# Patient Record
Sex: Female | Born: 1965 | Race: Black or African American | Hispanic: No | Marital: Single | Smoking: Current every day smoker
Health system: Southern US, Community
[De-identification: ages and names within clinical notes are randomized; demographics above are authoritative.]

## PROBLEM LIST (undated history)

## (undated) DIAGNOSIS — I10 Essential (primary) hypertension: Secondary | ICD-10-CM

---

## 2013-03-25 ENCOUNTER — Ambulatory Visit: Payer: Self-pay | Attending: Internal Medicine

## 2013-04-27 ENCOUNTER — Ambulatory Visit: Payer: No Typology Code available for payment source | Attending: Internal Medicine | Admitting: Internal Medicine

## 2013-04-27 ENCOUNTER — Encounter: Payer: Self-pay | Admitting: Internal Medicine

## 2013-04-27 VITALS — BP 169/122 | HR 108 | Temp 99.0°F | Resp 16 | Ht 65.0 in | Wt 147.0 lb

## 2013-04-27 DIAGNOSIS — R030 Elevated blood-pressure reading, without diagnosis of hypertension: Secondary | ICD-10-CM | POA: Insufficient documentation

## 2013-04-27 DIAGNOSIS — M549 Dorsalgia, unspecified: Secondary | ICD-10-CM

## 2013-04-27 DIAGNOSIS — Z0000 Encounter for general adult medical examination without abnormal findings: Secondary | ICD-10-CM

## 2013-04-27 DIAGNOSIS — IMO0001 Reserved for inherently not codable concepts without codable children: Secondary | ICD-10-CM

## 2013-04-27 LAB — LIPID PANEL
Cholesterol: 158 mg/dL (ref 0–200)
HDL: 51 mg/dL (ref >39)
LDL Cholesterol: 81 mg/dL (ref 0–99)
Triglycerides: 130 mg/dL (ref <150)

## 2013-04-27 LAB — COMPREHENSIVE METABOLIC PANEL
AST: 23 U/L (ref 0–37)
Albumin: 4.4 g/dL (ref 3.5–5.2)
Alkaline Phosphatase: 73 U/L (ref 39–117)
BUN: 9 mg/dL (ref 6–23)
CO2: 30 mEq/L (ref 19–32)
Creat: 0.63 mg/dL (ref 0.50–1.10)
Glucose, Bld: 98 mg/dL (ref 70–99)
Potassium: 4.3 mEq/L (ref 3.5–5.3)

## 2013-04-27 LAB — HEMOGLOBIN A1C
Hgb A1c MFr Bld: 6 % — ABNORMAL HIGH (ref <5.7)
Mean Plasma Glucose: 126 mg/dL — ABNORMAL HIGH (ref <117)

## 2013-04-27 LAB — CBC WITH DIFFERENTIAL/PLATELET
Basophils Absolute: 0 10*3/uL (ref 0.0–0.1)
Basophils Relative: 0 % (ref 0–1)
Eosinophils Absolute: 0.1 10*3/uL (ref 0.0–0.7)
Eosinophils Relative: 1 % (ref 0–5)
HCT: 40.6 % (ref 36.0–46.0)
Hemoglobin: 12.8 g/dL (ref 12.0–15.0)
MCH: 21.8 pg — ABNORMAL LOW (ref 26.0–34.0)
MCHC: 31.5 g/dL (ref 30.0–36.0)
Monocytes Absolute: 0.5 10*3/uL (ref 0.1–1.0)
Monocytes Relative: 6 % (ref 3–12)
Neutro Abs: 4.4 10*3/uL (ref 1.7–7.7)
WBC: 7.8 10*3/uL (ref 4.0–10.5)

## 2013-04-27 MED ORDER — ATENOLOL 25 MG PO TABS: 25.0000 mg | ORAL_TABLET | Freq: Every day | ORAL | Status: DC

## 2013-04-27 NOTE — Progress Notes (Signed)
Pt is here to establish care. Pt is requesting a physical. She is requesting referrals. Pt states that her left side has occasional pain.

## 2013-04-27 NOTE — Progress Notes (Signed)
Patient ID: Regina Daugherty, female   DOB: 12/04/1965, 47 y.o.   MRN: [id number] Patient Demographics  Regina Daugherty, is a 47 y.o. female  ZDG:[id number]  [id number]  DOB - 1965/06/06  Chief Complaint  Patient presents with  . Establish Care        Subjective:   Regina Daugherty today is here to establish primary care.  Patient is a 47 year old female with no known medical history, does not follow any physicians or medical care presented to establish care. Patient's BP was 169/122 at the time of arrival, states that she does know if she has any hypertension. States that she's anxious. She never had mammogram. Diabetes runs in her family. Patient also reports that she has neck pain, left arm numbness, back pain, left leg pain. No chest pain, palpitations, diaphoresis. Patient has No headache, No chest pain, No abdominal pain - No Nausea, No Cough - SOB.  Objective:    Filed Vitals:   04/27/13 1359  BP: 169/122  Pulse: 108  Temp: 99 F (37.2 C)  TempSrc: Oral  Resp: 16  Height: 5\' 5"  (1.651 m)  Weight: 147 lb (66.679 kg)  SpO2: 99%     ALLERGIES:  No Known Allergies  PAST MEDICAL HISTORY: History reviewed. No pertinent past medical history.  PAST SURGICAL HISTORY: History reviewed. No pertinent past surgical history.  FAMILY HISTORY: Family History  Problem Relation Age of Onset  . Cancer Mother   . Diabetes Mother   . Cancer Father   . Diabetes Father   . Cancer Sister   . Heart disease Sister   . Hypertension Sister   . Diabetes Sister     MEDICATIONS AT HOME: Prior to Admission medications   Medication Sig Start Date End Date Taking? Authorizing Provider  atenolol (TENORMIN) 25 MG tablet Take 1 tablet (25 mg total) by mouth daily. 04/27/13   Ripudeep Jenna Luo, MD    REVIEW OF SYSTEMS:  Constitutional:   No   Fevers, chills, fatigue.  HEENT:    No headaches, Sore throat,   Cardio-vascular: No chest pain,  Orthopnea, swelling in lower  extremities, anasarca, palpitations  GI:  No abdominal pain, nausea, vomiting, diarrhea  Resp: No shortness of breath,  No coughing up of blood.No cough.No wheezing.  Skin:  no rash or lesions.  GU:  no dysuria, change in color of urine, no urgency or frequency.  No flank pain.  Musculoskeletal: No joint pain or swelling.  No decreased range of motion.  No back pain.  Psych: No change in mood or affect. No depression or anxiety.  No memory loss.   Exam  General appearance :Awake, alert, NAD, Speech Clear. HEENT: Atraumatic and Normocephalic, PERLA Neck: supple, no JVD. No cervical lymphadenopathy.  Chest: clear to auscultation bilaterally, no wheezing, rales or rhonchi CVS: S1 S2 regular, no murmurs.  Abdomen: soft, NBS, NT, ND, no gaurding, rigidity or rebound. Extremities: No cyanosis, clubbing, B/L Lower Ext shows no edema,  Neurology: Awake alert, and oriented X 3, CN II-XII intact, Non focal Skin:No Rash or lesions Wounds: N/A    Data Review   Basic Metabolic Panel: No results found for this basename: NA, K, CL, CO2, GLUCOSE, BUN, CREATININE, CALCIUM, MG, PHOS,  in the last 168 hours Liver Function Tests: No results found for this basename: AST, ALT, ALKPHOS, BILITOT, PROT, ALBUMIN,  in the last 168 hours  CBC: No results found for this basename: WBC, NEUTROABS, HGB, HCT, MCV, PLT,  in the last 168 hours ------------------------------------------------------------------------------------------------------------------  No results found for this basename: HGBA1C,  in the last 72 hours ------------------------------------------------------------------------------------------------------------------ No results found for this basename: CHOL, HDL, LDLCALC, TRIG, CHOLHDL, LDLDIRECT,  in the last 72 hours ------------------------------------------------------------------------------------------------------------------ No results found for this basename: TSH, T4TOTAL,  FREET3, T3FREE, THYROIDAB,  in the last 72 hours ------------------------------------------------------------------------------------------------------------------ No results found for this basename: VITAMINB12, FOLATE, FERRITIN, TIBC, IRON, RETICCTPCT,  in the last 72 hours  Coagulation profile  No results found for this basename: INR, PROTIME,  in the last 168 hours    Assessment & Plan   Active Problems: Patient Active Problem List   Diagnosis Date Noted  . Elevated BP -Patient's BP is still very high although she has not been diagnosed formally with hypertension  - Will check the labs as stated below, start patient on atenolol 25 mg daily    04/27/2013  . Periodic health assessment, general screening, adult - Check CBC, CMET, lipid panel, TSH, folic acid, vitamin B12, hemoglobin A1c - Ambulatory referral to OB/GYN for Pap smear - Screening mammogram ordered - She declined flu shot  04/27/2013  . Back pain -  obtain x-ray of the cervical, thoracic, lumbar spine.     04/27/2013     recommendations : follow labs, patient will be called with any abnormal results   Follow-up in  2 months    RAI,RIPUDEEP M.D. 04/27/2013, 2:34 PM

## 2013-04-28 ENCOUNTER — Other Ambulatory Visit: Payer: Self-pay | Admitting: Internal Medicine

## 2013-04-28 DIAGNOSIS — Z1231 Encounter for screening mammogram for malignant neoplasm of breast: Secondary | ICD-10-CM

## 2013-04-28 LAB — FOLATE: Folate: >20 ng/mL

## 2013-05-23 ENCOUNTER — Other Ambulatory Visit: Payer: Self-pay | Admitting: Internal Medicine

## 2013-05-23 ENCOUNTER — Ambulatory Visit (HOSPITAL_COMMUNITY)
Admission: RE | Admit: 2013-05-23 | Discharge: 2013-05-23 | Disposition: A | Discharge status: Home / Self Care | Payer: No Typology Code available for payment source | Source: Ambulatory Visit | Attending: Internal Medicine | Admitting: Internal Medicine

## 2013-05-23 DIAGNOSIS — Z0000 Encounter for general adult medical examination without abnormal findings: Secondary | ICD-10-CM

## 2013-05-23 DIAGNOSIS — IMO0001 Reserved for inherently not codable concepts without codable children: Secondary | ICD-10-CM

## 2013-05-23 DIAGNOSIS — M47812 Spondylosis without myelopathy or radiculopathy, cervical region: Secondary | ICD-10-CM | POA: Insufficient documentation

## 2013-05-23 DIAGNOSIS — I7090 Unspecified atherosclerosis: Secondary | ICD-10-CM | POA: Insufficient documentation

## 2013-05-23 DIAGNOSIS — M25559 Pain in unspecified hip: Secondary | ICD-10-CM | POA: Insufficient documentation

## 2013-05-23 DIAGNOSIS — M25519 Pain in unspecified shoulder: Secondary | ICD-10-CM | POA: Insufficient documentation

## 2013-05-23 DIAGNOSIS — R030 Elevated blood-pressure reading, without diagnosis of hypertension: Secondary | ICD-10-CM

## 2013-05-23 DIAGNOSIS — M4000 Postural kyphosis, site unspecified: Secondary | ICD-10-CM | POA: Insufficient documentation

## 2013-05-23 DIAGNOSIS — M47817 Spondylosis without myelopathy or radiculopathy, lumbosacral region: Secondary | ICD-10-CM | POA: Insufficient documentation

## 2013-05-23 DIAGNOSIS — M4120 Other idiopathic scoliosis, site unspecified: Secondary | ICD-10-CM | POA: Insufficient documentation

## 2013-05-23 DIAGNOSIS — M79609 Pain in unspecified limb: Secondary | ICD-10-CM | POA: Insufficient documentation

## 2013-05-23 DIAGNOSIS — M5137 Other intervertebral disc degeneration, lumbosacral region: Secondary | ICD-10-CM | POA: Insufficient documentation

## 2013-05-23 DIAGNOSIS — M51379 Other intervertebral disc degeneration, lumbosacral region without mention of lumbar back pain or lower extremity pain: Secondary | ICD-10-CM | POA: Insufficient documentation

## 2013-05-23 DIAGNOSIS — R209 Unspecified disturbances of skin sensation: Secondary | ICD-10-CM | POA: Insufficient documentation

## 2013-06-01 ENCOUNTER — Ambulatory Visit: Payer: No Typology Code available for payment source

## 2013-06-02 ENCOUNTER — Ambulatory Visit
Admission: RE | Admit: 2013-06-02 | Discharge: 2013-06-02 | Disposition: A | Discharge status: Home / Self Care | Payer: No Typology Code available for payment source | Source: Ambulatory Visit | Attending: Internal Medicine | Admitting: Internal Medicine

## 2013-06-02 DIAGNOSIS — Z1231 Encounter for screening mammogram for malignant neoplasm of breast: Secondary | ICD-10-CM

## 2013-06-14 ENCOUNTER — Encounter: Payer: Self-pay | Admitting: Obstetrics & Gynecology

## 2013-06-28 ENCOUNTER — Ambulatory Visit: Payer: Self-pay | Attending: Internal Medicine | Admitting: Internal Medicine

## 2013-06-28 ENCOUNTER — Ambulatory Visit: Payer: Self-pay | Attending: Internal Medicine

## 2013-06-28 ENCOUNTER — Encounter: Payer: Self-pay | Admitting: Internal Medicine

## 2013-06-28 VITALS — BP 130/80 | HR 87 | Temp 98.3°F | Resp 16 | Ht 65.0 in | Wt 150.0 lb

## 2013-06-28 DIAGNOSIS — I10 Essential (primary) hypertension: Secondary | ICD-10-CM | POA: Insufficient documentation

## 2013-06-28 DIAGNOSIS — IMO0001 Reserved for inherently not codable concepts without codable children: Secondary | ICD-10-CM

## 2013-06-28 DIAGNOSIS — Z1211 Encounter for screening for malignant neoplasm of colon: Secondary | ICD-10-CM

## 2013-06-28 DIAGNOSIS — Z09 Encounter for follow-up examination after completed treatment for conditions other than malignant neoplasm: Secondary | ICD-10-CM | POA: Insufficient documentation

## 2013-06-28 DIAGNOSIS — F17200 Nicotine dependence, unspecified, uncomplicated: Secondary | ICD-10-CM

## 2013-06-28 DIAGNOSIS — R7301 Impaired fasting glucose: Secondary | ICD-10-CM | POA: Insufficient documentation

## 2013-06-28 DIAGNOSIS — M1990 Unspecified osteoarthritis, unspecified site: Secondary | ICD-10-CM

## 2013-06-28 HISTORY — DX: Unspecified osteoarthritis, unspecified site: M19.90

## 2013-06-28 HISTORY — DX: Essential (primary) hypertension: I10

## 2013-06-28 HISTORY — DX: Nicotine dependence, unspecified, uncomplicated: F17.200

## 2013-06-28 HISTORY — DX: Impaired fasting glucose: R73.01

## 2013-06-28 HISTORY — DX: Reserved for inherently not codable concepts without codable children: IMO0001

## 2013-06-28 MED ORDER — NICOTINE 21 MG/24HR TD PT24: 21.0000 mg | MEDICATED_PATCH | Freq: Every day | TRANSDERMAL | Status: DC

## 2013-06-28 MED ORDER — ATENOLOL 25 MG PO TABS: 25.0000 mg | ORAL_TABLET | Freq: Every day | ORAL | Status: DC

## 2013-06-28 MED ORDER — NAPROXEN 500 MG PO TABS: 500.0000 mg | ORAL_TABLET | Freq: Two times a day (BID) | ORAL | Status: DC

## 2013-06-28 NOTE — Progress Notes (Signed)
Pt here to f/u HTn with medication management. Taking meds daily BP- 142/ 94 educated Need refill

## 2013-06-28 NOTE — Progress Notes (Signed)
MRN: [id number] Name: Regina CroftLucinda Funk  Sex: female Age: 48 y.o. DOB: 1965-07-03  Allergies: Review of patient's allergies indicates no known allergies.  Chief Complaint  Patient presents with  . Follow-up  . Hypertension    HPI: Patient is 48 y.o. female who comes today for followup, she has history of hypertension currently taking Tenormin today milligram denies any headache dizziness chest and shortness of breath, has history of back pain, she had x-rays done reported to have DJD, she had a blood work done which was reviewed noticed to have impaired fasting glucose, patient has family history of diabetes. Patient also smokes cigarettes , agreeable to try nicotine patch.   History reviewed. No pertinent past medical history.  History reviewed. No pertinent past surgical history.    Medication List       This list is accurate as of: 06/28/13 11:24 AM.  Always use your most recent med list.               atenolol 25 MG tablet  Commonly known as:  TENORMIN  Take 1 tablet (25 mg total) by mouth daily.     naproxen 500 MG tablet  Commonly known as:  NAPROSYN  Take 1 tablet (500 mg total) by mouth 2 (two) times daily with a meal.     nicotine 21 mg/24hr patch  Commonly known as:  NICODERM CQ  Place 1 patch (21 mg total) onto the skin daily.        Meds ordered this encounter  Medications  . nicotine (NICODERM CQ) 21 mg/24hr patch    Sig: Place 1 patch (21 mg total) onto the skin daily.    Dispense:  28 patch    Refill:  0  . naproxen (NAPROSYN) 500 MG tablet    Sig: Take 1 tablet (500 mg total) by mouth 2 (two) times daily with a meal.    Dispense:  30 tablet    Refill:  2  . atenolol (TENORMIN) 25 MG tablet    Sig: Take 1 tablet (25 mg total) by mouth daily.    Dispense:  30 tablet    Refill:  3     There is no immunization history on file for this patient.  Family History  Problem Relation Age of Onset  . Cancer Mother   . Diabetes Mother   .  Cancer Father   . Diabetes Father   . Cancer Sister   . Heart disease Sister   . Hypertension Sister   . Diabetes Sister     History  Substance Use Topics  . Smoking status: Current Every Day Smoker -- 0.50 packs/day for 15 years    Types: Cigarettes  . Smokeless tobacco: Not on file  . Alcohol Use: 1.0 oz/week    2 drink(s) per week     Comment: couple of beers     Review of Systems   As noted in HPI  Filed Vitals:   06/28/13 1054  BP: 130/80  Pulse:   Temp:   Resp:     Physical Exam  Physical Exam  Constitutional: No distress.  Eyes: EOM are normal. Pupils are equal, round, and reactive to light.  Cardiovascular: Normal rate, regular rhythm and normal heart sounds.   Pulmonary/Chest: Breath sounds normal. No respiratory distress. She has no wheezes. She has no rales.  Musculoskeletal: She exhibits no edema.  Minimal lower lumbar paraspinal tenderness    CBC    Component Value Date/Time   WBC 7.8  04/27/2013 1442   RBC 5.88* 04/27/2013 1442   HGB 12.8 04/27/2013 1442   HCT 40.6 04/27/2013 1442   PLT 294 04/27/2013 1442   MCV 69.0* 04/27/2013 1442   LYMPHSABS 2.8 04/27/2013 1442   MONOABS 0.5 04/27/2013 1442   EOSABS 0.1 04/27/2013 1442   BASOSABS 0.0 04/27/2013 1442    CMP     Component Value Date/Time   NA 142 04/27/2013 1442   K 4.3 04/27/2013 1442   CL 103 04/27/2013 1442   CO2 30 04/27/2013 1442   GLUCOSE 98 04/27/2013 1442   BUN 9 04/27/2013 1442   CREATININE 0.63 04/27/2013 1442   CALCIUM 9.9 04/27/2013 1442   PROT 7.4 04/27/2013 1442   ALBUMIN 4.4 04/27/2013 1442   AST 23 04/27/2013 1442   ALT 21 04/27/2013 1442   ALKPHOS 73 04/27/2013 1442   BILITOT 0.7 04/27/2013 1442    Lab Results  Component Value Date/Time   CHOL 158 04/27/2013  2:42 PM    No components found with this basename: hga1c    Lab Results  Component Value Date/Time   AST 23 04/27/2013  2:42 PM    Assessment and Plan  Essential hypertension,  benign Manual blood pressure is 130/80 Continue with Tenormin, also advise for low salt diet  DJD (degenerative joint disease) - Plan: Advised patient to apply heating pad, naproxen (NAPROSYN) 500 MG tablet when necessary  IFG (impaired fasting glucose) Advised patient for low carbohydrate diet  Smoking - Plan: nicotine (NICODERM CQ) 21 mg/24hr patch  Special screening for malignant neoplasms, colon - Plan: Ambulatory referral to Gastroenterology   Health Maintenance -Colonoscopy: Referral done -Mammogram: Up to date Patient declined flu shot   Return in about 3 months (around 09/25/2013).  Doris Cheadle, MD

## 2013-06-28 NOTE — Patient Instructions (Addendum)
Diabetes Meal Planning Guide The diabetes meal planning guide is a tool to help you plan your meals and snacks. It is important for people with diabetes to manage their blood glucose (sugar) levels. Choosing the right foods and the right amounts throughout your day will help control your blood glucose. Eating right can even help you improve your blood pressure and reach or maintain a healthy weight. CARBOHYDRATE COUNTING MADE EASY When you eat carbohydrates, they turn to sugar. This raises your blood glucose level. Counting carbohydrates can help you control this level so you feel better. When you plan your meals by counting carbohydrates, you can have more flexibility in what you eat and balance your medicine with your food intake. Carbohydrate counting simply means adding up the total amount of carbohydrate grams in your meals and snacks. Try to eat about the same amount at each meal. Foods with carbohydrates are listed below. Each portion below is 1 carbohydrate serving or 15 grams of carbohydrates. Ask your dietician how many grams of carbohydrates you should eat at each meal or snack. Grains and Starches  1 slice bread.   English muffin or hotdog/hamburger bun.   cup cold cereal (unsweetened).   cup cooked pasta or rice.   cup starchy vegetables (corn, potatoes, peas, beans, winter squash).  1 tortilla (6 inches).   bagel.  1 waffle or pancake (size of a CD).   cup cooked cereal.  4 to 6 small crackers. *Whole grain is recommended. Fruit  1 cup fresh unsweetened berries, melon, papaya, pineapple.  1 small fresh fruit.   banana or mango.   cup fruit juice (4 oz unsweetened).   cup canned fruit in natural juice or water.  2 tbs dried fruit.  12 to 15 grapes or cherries. Milk and Yogurt  1 cup fat-free or 1% milk.  1 cup soy milk.  6 oz light yogurt with sugar-free sweetener.  6 oz low-fat soy yogurt.  6 oz plain yogurt. Vegetables  1 cup raw or  cup  cooked is counted as 0 carbohydrates or a "free" food.  If you eat 3 or more servings at 1 meal, count them as 1 carbohydrate serving. Other Carbohydrates   oz chips or pretzels.   cup ice cream or frozen yogurt.   cup sherbet or sorbet.  2 inch square cake, no frosting.  1 tbs honey, sugar, jam, jelly, or syrup.  2 small cookies.  3 squares of graham crackers.  3 cups popcorn.  6 crackers.  1 cup broth-based soup.  Count 1 cup casserole or other mixed foods as 2 carbohydrate servings.  Foods with less than 20 calories in a serving may be counted as 0 carbohydrates or a "free" food. You may want to purchase a book or computer software that lists the carbohydrate gram counts of different foods. In addition, the nutrition facts panel on the labels of the foods you eat are a good source of this information. The label will tell you how big the serving size is and the total number of carbohydrate grams you will be eating per serving. Divide this number by 15 to obtain the number of carbohydrate servings in a portion. Remember, 1 carbohydrate serving equals 15 grams of carbohydrate. SERVING SIZES Measuring foods and serving sizes helps you make sure you are getting the right amount of food. The list below tells how big or small some common serving sizes are.  1 oz.........4 stacked dice.  3 oz.........Deck of cards.  1 tsp........Tip   of little finger.  1 tbs........Thumb.  2 tbs........Golf ball.   cup.......Half of a fist.  1 cup........A fist. SAMPLE DIABETES MEAL PLAN Below is a sample meal plan that includes foods from the grain and starches, dairy, vegetable, fruit, and meat groups. A dietician can individualize a meal plan to fit your calorie needs and tell you the number of servings needed from each food group. However, controlling the total amount of carbohydrates in your meal or snack is more important than making sure you include all of the food groups at every  meal. You may interchange carbohydrate containing foods (dairy, starches, and fruits). The meal plan below is an example of a 2000 calorie diet using carbohydrate counting. This meal plan has 17 carbohydrate servings. Breakfast  1 cup oatmeal (2 carb servings).   cup light yogurt (1 carb serving).  1 cup blueberries (1 carb serving).   cup almonds. Snack  1 large apple (2 carb servings).  1 low-fat string cheese stick. Lunch  Chicken breast salad.  1 cup spinach.   cup chopped tomatoes.  2 oz chicken breast, sliced.  2 tbs low-fat Italian dressing.  12 whole-wheat crackers (2 carb servings).  12 to 15 grapes (1 carb serving).  1 cup low-fat milk (1 carb serving). Snack  1 cup carrots.   cup hummus (1 carb serving). Dinner  3 oz broiled salmon.  1 cup brown rice (3 carb servings). Snack  1  cups steamed broccoli (1 carb serving) drizzled with 1 tsp olive oil and lemon juice.  1 cup light pudding (2 carb servings). DIABETES MEAL PLANNING WORKSHEET Your dietician can use this worksheet to help you decide how many servings of foods and what types of foods are right for you.  BREAKFAST Food Group and Servings / Carb Servings Grain/Starches __________________________________ Dairy __________________________________________ Vegetable ______________________________________ Fruit ___________________________________________ Meat __________________________________________ Fat ____________________________________________ LUNCH Food Group and Servings / Carb Servings Grain/Starches ___________________________________ Dairy ___________________________________________ Fruit ____________________________________________ Meat ___________________________________________ Fat _____________________________________________ DINNER Food Group and Servings / Carb Servings Grain/Starches ___________________________________ Dairy  ___________________________________________ Fruit ____________________________________________ Meat ___________________________________________ Fat _____________________________________________ SNACKS Food Group and Servings / Carb Servings Grain/Starches ___________________________________ Dairy ___________________________________________ Vegetable _______________________________________ Fruit ____________________________________________ Meat ___________________________________________ Fat _____________________________________________ DAILY TOTALS Starches _________________________ Vegetable ________________________ Fruit ____________________________ Dairy ____________________________ Meat ____________________________ Fat ______________________________ Document Released: 01/30/2005 Document Revised: 07/28/2011 Document Reviewed: 12/11/2008 [employer] Patient Information 2014 [employer] 2 Gram Low Sodium Diet A 2 gram sodium diet restricts the amount of sodium in the diet to no more than 2 g or 2000 mg daily. Limiting the amount of sodium is often used to help lower blood pressure. It is important if you have heart, liver, or kidney problems. Many foods contain sodium for flavor and sometimes as a preservative. When the amount of sodium in a diet needs to be low, it is important to know what to look for when choosing foods and drinks. The following includes some information and guidelines to help make it easier for you to adapt to a low sodium diet. QUICK TIPS  Do not add salt to food.  Avoid convenience items and fast food.  Choose unsalted snack foods.  Buy lower sodium products, often labeled as "lower sodium" or "no salt added."  Check food labels to learn how much sodium is in 1 serving.  When eating at a restaurant, ask that your food be prepared with less salt or none, if possible. READING FOOD LABELS FOR SODIUM INFORMATION The nutrition facts label is a good place to    find how much sodium is in foods. Look for products with no more than 500 to 600 mg of sodium per meal and no more than 150 mg per serving. Remember that 2 g = 2000 mg. The food label may also list foods as:  Sodium-free: Less than 5 mg in a serving.  Very low sodium: 35 mg or less in a serving.  Low-sodium: 140 mg or less in a serving.  Light in sodium: 50% less sodium in a serving. For example, if a food that usually has 300 mg of sodium is changed to become light in sodium, it will have 150 mg of sodium.  Reduced sodium: 25% less sodium in a serving. For example, if a food that usually has 400 mg of sodium is changed to reduced sodium, it will have 300 mg of sodium. CHOOSING FOODS Grains  Avoid: Salted crackers and snack items. Some cereals, including instant hot cereals. Bread stuffing and biscuit mixes. Seasoned rice or pasta mixes.  Choose: Unsalted snack items. Low-sodium cereals, oats, puffed wheat and rice, shredded wheat. English muffins and bread. Pasta. Meats  Avoid: Salted, canned, smoked, spiced, pickled meats, including fish and poultry. Bacon, ham, sausage, cold cuts, hot dogs, anchovies.  Choose: Low-sodium canned tuna and salmon. Fresh or frozen meat, poultry, and fish. Dairy  Avoid: Processed cheese and spreads. Cottage cheese. Buttermilk and condensed milk. Regular cheese.  Choose: Milk. Low-sodium cottage cheese. Yogurt. Sour cream. Low-sodium cheese. Fruits and Vegetables  Avoid: Regular canned vegetables. Regular canned tomato sauce and paste. Frozen vegetables in sauces. Olives. Pickles. Relishes. Sauerkraut.  Choose: Low-sodium canned vegetables. Low-sodium tomato sauce and paste. Frozen or fresh vegetables. Fresh and frozen fruit. Condiments  Avoid: Canned and packaged gravies. Worcestershire sauce. Tartar sauce. Barbecue sauce. Soy sauce. Steak sauce. Ketchup. Onion, garlic, and table salt. Meat flavorings and tenderizers.  Choose: Fresh and dried  herbs and spices. Low-sodium varieties of mustard and ketchup. Lemon juice. Tabasco sauce. Horseradish. SAMPLE 2 GRAM SODIUM MEAL PLAN Breakfast / Sodium (mg)  1 cup low-fat milk / 143 mg  2 slices whole-wheat toast / 270 mg  1 tbs heart-healthy margarine / 153 mg  1 hard-boiled egg / 139 mg  1 small orange / 0 mg Lunch / Sodium (mg)  1 cup raw carrots / 76 mg   cup hummus / 298 mg  1 cup low-fat milk / 143 mg   cup red grapes / 2 mg  1 whole-wheat pita bread / 356 mg Dinner / Sodium (mg)  1 cup whole-wheat pasta / 2 mg  1 cup low-sodium tomato sauce / 73 mg  3 oz lean ground beef / 57 mg  1 small side salad (1 cup raw spinach leaves,  cup cucumber,  cup yellow bell pepper) with 1 tsp olive oil and 1 tsp red wine vinegar / 25 mg Snack / Sodium (mg)  1 container low-fat vanilla yogurt / 107 mg  3 graham cracker squares / 127 mg Nutrient Analysis  Calories: 2033  Protein: 77 g  Carbohydrate: 282 g  Fat: 72 g  Sodium: 1971 mg Document Released: 05/05/2005 Document Revised: 07/28/2011 Document Reviewed: 08/06/2009 [employer] Patient Information 2014 [employer]  

## 2013-07-25 ENCOUNTER — Encounter: Payer: Self-pay | Admitting: Obstetrics & Gynecology

## 2013-09-26 ENCOUNTER — Ambulatory Visit: Payer: Self-pay | Admitting: Internal Medicine

## 2014-04-19 ENCOUNTER — Encounter (HOSPITAL_COMMUNITY): Payer: Self-pay | Admitting: *Deleted

## 2014-04-19 ENCOUNTER — Emergency Department (HOSPITAL_COMMUNITY)
Admission: EM | Admit: 2014-04-19 | Discharge: 2014-04-19 | Disposition: A | Discharge status: Home / Self Care | Payer: Self-pay | Attending: Emergency Medicine | Admitting: Emergency Medicine

## 2014-04-19 DIAGNOSIS — Z791 Long term (current) use of non-steroidal anti-inflammatories (NSAID): Secondary | ICD-10-CM | POA: Insufficient documentation

## 2014-04-19 DIAGNOSIS — I10 Essential (primary) hypertension: Secondary | ICD-10-CM | POA: Insufficient documentation

## 2014-04-19 DIAGNOSIS — Z720 Tobacco use: Secondary | ICD-10-CM | POA: Insufficient documentation

## 2014-04-19 DIAGNOSIS — Z79899 Other long term (current) drug therapy: Secondary | ICD-10-CM | POA: Insufficient documentation

## 2014-04-19 HISTORY — DX: Essential (primary) hypertension: I10

## 2014-04-19 MED ORDER — ATENOLOL 25 MG PO TABS: 25.0000 mg | ORAL_TABLET | Freq: Two times a day (BID) | ORAL | Status: DC

## 2014-04-19 NOTE — Discharge Instructions (Signed)
Check your bp twice a week at the fire department or rescue place.  Write the bp down with the date and then follow up with your md in 2-3 weeks

## 2014-04-19 NOTE — ED Provider Notes (Signed)
CSN: [id number]     Arrival date & time 04/19/14  1017 History   First MD Initiated Contact with Patient 04/19/14 1043     Chief Complaint  Patient presents with  . Hypertension     (Consider location/radiation/quality/duration/timing/severity/associated sxs/prior Treatment) Patient is a 48 y.o. female presenting with hypertension. The history is provided by the patient (the pt states her bp has been to high to give plasma).  Hypertension This is a recurrent problem. The current episode started more than 2 days ago. The problem occurs constantly. The problem has not changed since onset.Pertinent negatives include no chest pain, no abdominal pain and no headaches. Nothing aggravates the symptoms. Nothing relieves the symptoms.    Past Medical History  Diagnosis Date  . Hypertension    History reviewed. No pertinent past surgical history. Family History  Problem Relation Age of Onset  . Cancer Mother   . Diabetes Mother   . Cancer Father   . Diabetes Father   . Cancer Sister   . Heart disease Sister   . Hypertension Sister   . Diabetes Sister    History  Substance Use Topics  . Smoking status: Current Every Day Smoker -- 0.50 packs/day for 15 years    Types: Cigarettes  . Smokeless tobacco: Not on file  . Alcohol Use: 1.0 oz/week    2 Not specified per week     Comment: couple of beers    OB History    No data available     Review of Systems  Constitutional: Negative for appetite change and fatigue.  HENT: Negative for congestion, ear discharge and sinus pressure.   Eyes: Negative for discharge.  Respiratory: Negative for cough.   Cardiovascular: Negative for chest pain.  Gastrointestinal: Negative for abdominal pain and diarrhea.  Genitourinary: Negative for frequency and hematuria.  Musculoskeletal: Negative for back pain.  Skin: Negative for rash.  Neurological: Negative for seizures and headaches.  Psychiatric/Behavioral: Negative for hallucinations.       Allergies  Review of patient's allergies indicates no known allergies.  Home Medications   Prior to Admission medications   Medication Sig Start Date End Date Taking? Authorizing Provider  atenolol (TENORMIN) 25 MG tablet Take 1 tablet (25 mg total) by mouth daily. 06/28/13   Doris Cheadleeepak Advani, MD  atenolol (TENORMIN) 25 MG tablet Take 1 tablet (25 mg total) by mouth 2 (two) times daily. 04/19/14   Benny LennertJoseph L Amario Longmore, MD  naproxen (NAPROSYN) 500 MG tablet Take 1 tablet (500 mg total) by mouth 2 (two) times daily with a meal. 06/28/13   Doris Cheadleeepak Advani, MD  nicotine (NICODERM CQ) 21 mg/24hr patch Place 1 patch (21 mg total) onto the skin daily. 06/28/13   Doris Cheadleeepak Advani, MD   BP 153/90 mmHg  Pulse 86  Temp(Src) 98.1 F (36.7 C) (Oral)  Resp 20  SpO2 100% Physical Exam  Constitutional: She is oriented to person, place, and time. She appears well-developed.  HENT:  Head: Normocephalic.  Eyes: Conjunctivae and EOM are normal. No scleral icterus.  Neck: Neck supple. No thyromegaly present.  Cardiovascular: Normal rate and regular rhythm.  Exam reveals no gallop and no friction rub.   No murmur heard. Pulmonary/Chest: No stridor. She has no wheezes. She has no rales. She exhibits no tenderness.  Abdominal: She exhibits no distension. There is no tenderness. There is no rebound.  Musculoskeletal: Normal range of motion. She exhibits no edema.  Lymphadenopathy:    She has no cervical adenopathy.  Neurological: She  is oriented to person, place, and time. She exhibits normal muscle tone. Coordination normal.  Skin: No rash noted. No erythema.  Psychiatric: She has a normal mood and affect. Her behavior is normal.    ED Course  Procedures (including critical care time) Labs Review Labs Reviewed - No data to display  Imaging Review No results found.   EKG Interpretation None      MDM   Final diagnoses:  Essential hypertension    Htn,   Increase atenolol to bid    Benny LennertJoseph L  Davinity Fanara, MD 04/19/14 1051

## 2014-04-19 NOTE — ED Notes (Signed)
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 bp is 135/99 at triage.

## 2015-11-17 IMAGING — CR DG THORACIC SPINE 2V
3 series · 3 of 3 positions shown · non-contrast
Comparison: Cervical and lumbar spine plain films obtained the same
date and dictated separately. No comparison thoracic spine exam.

CLINICAL DATA: No injury. Pain and numbness left arm and left
posterior shoulder.

EXAM:
THORACIC SPINE - 2 VIEW

[t t-spine a.p.]
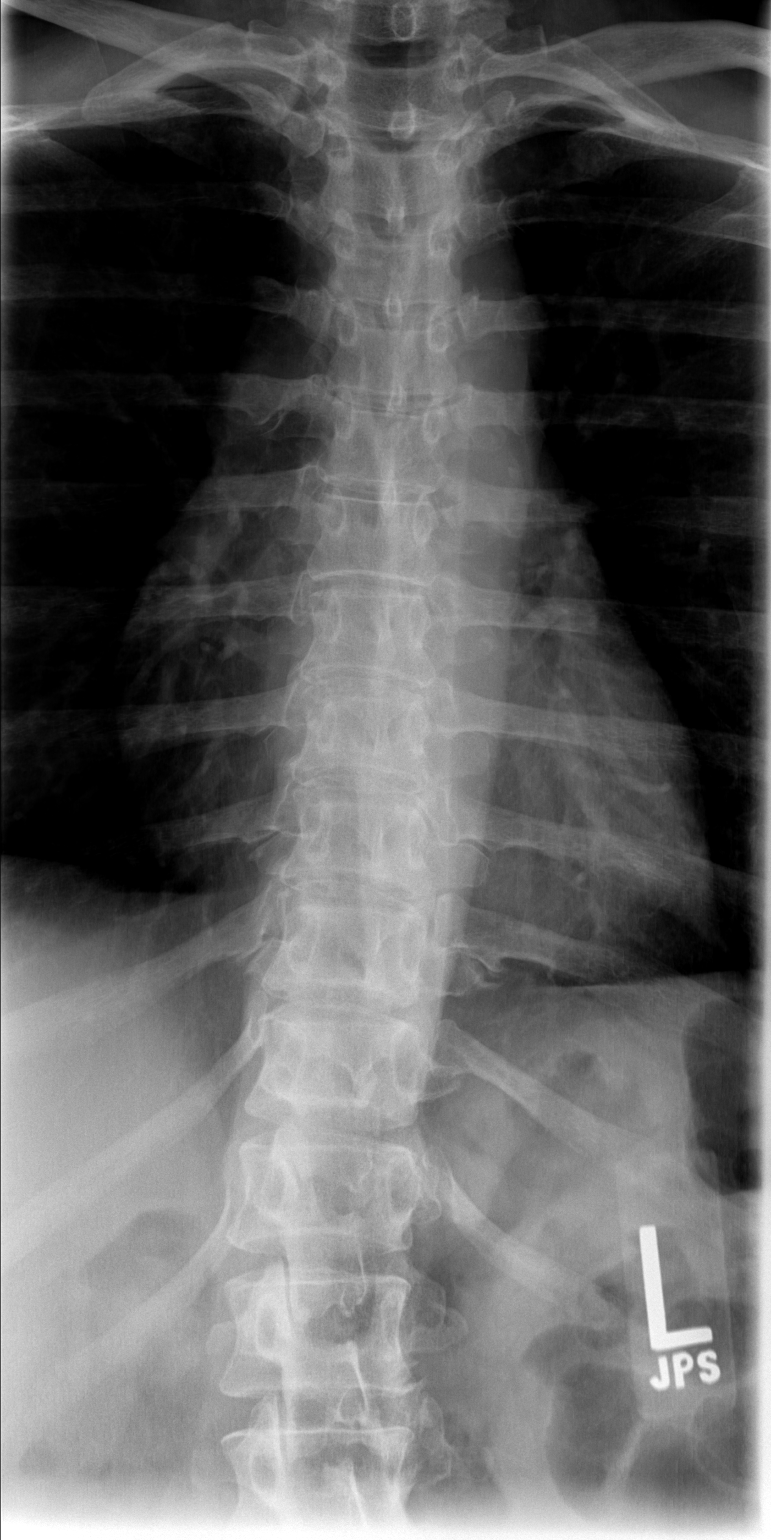

[t t-spine lat *]
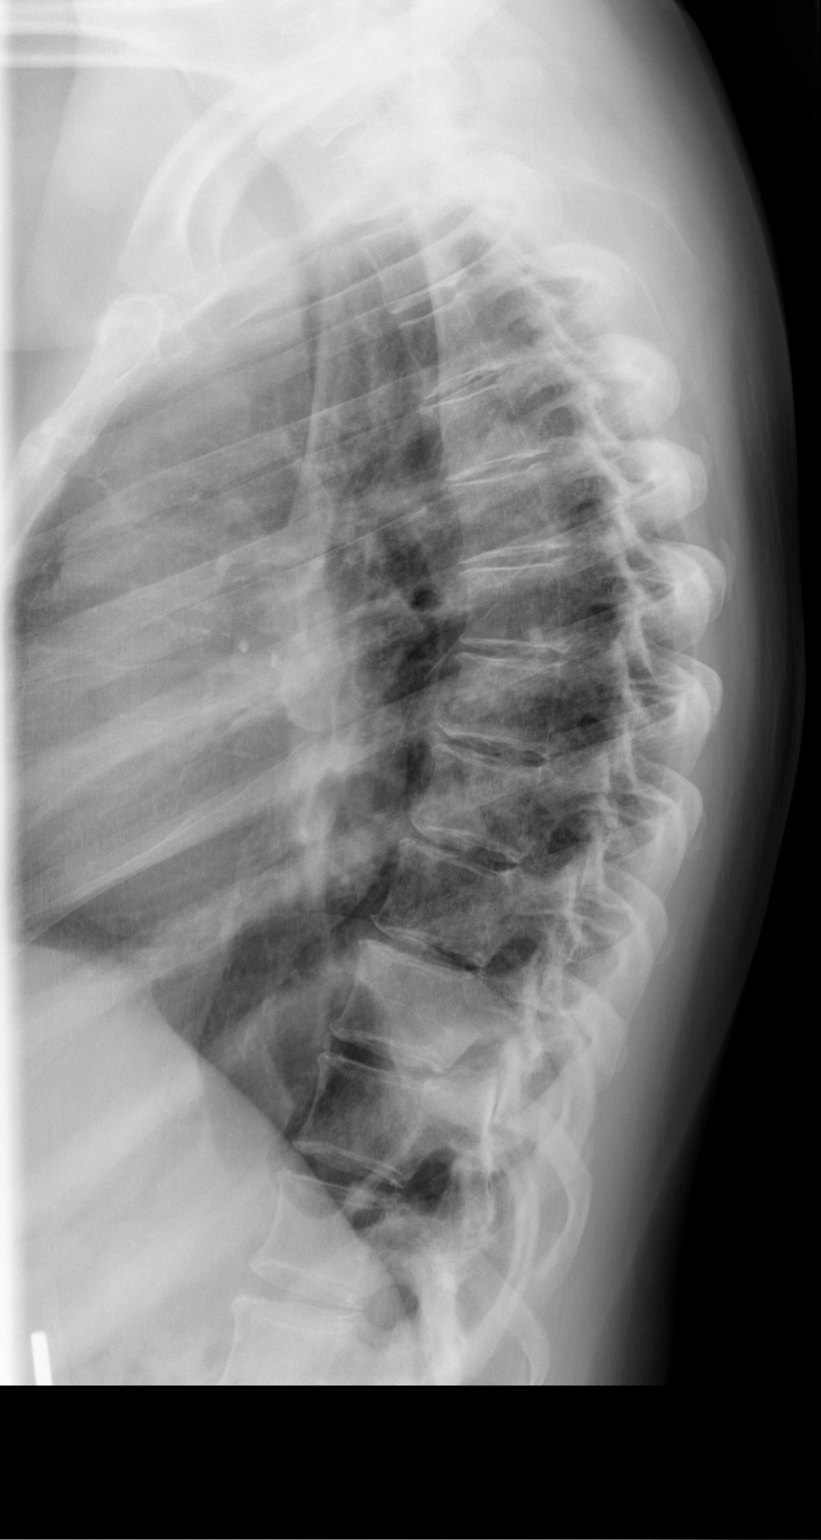

[t swimmers]
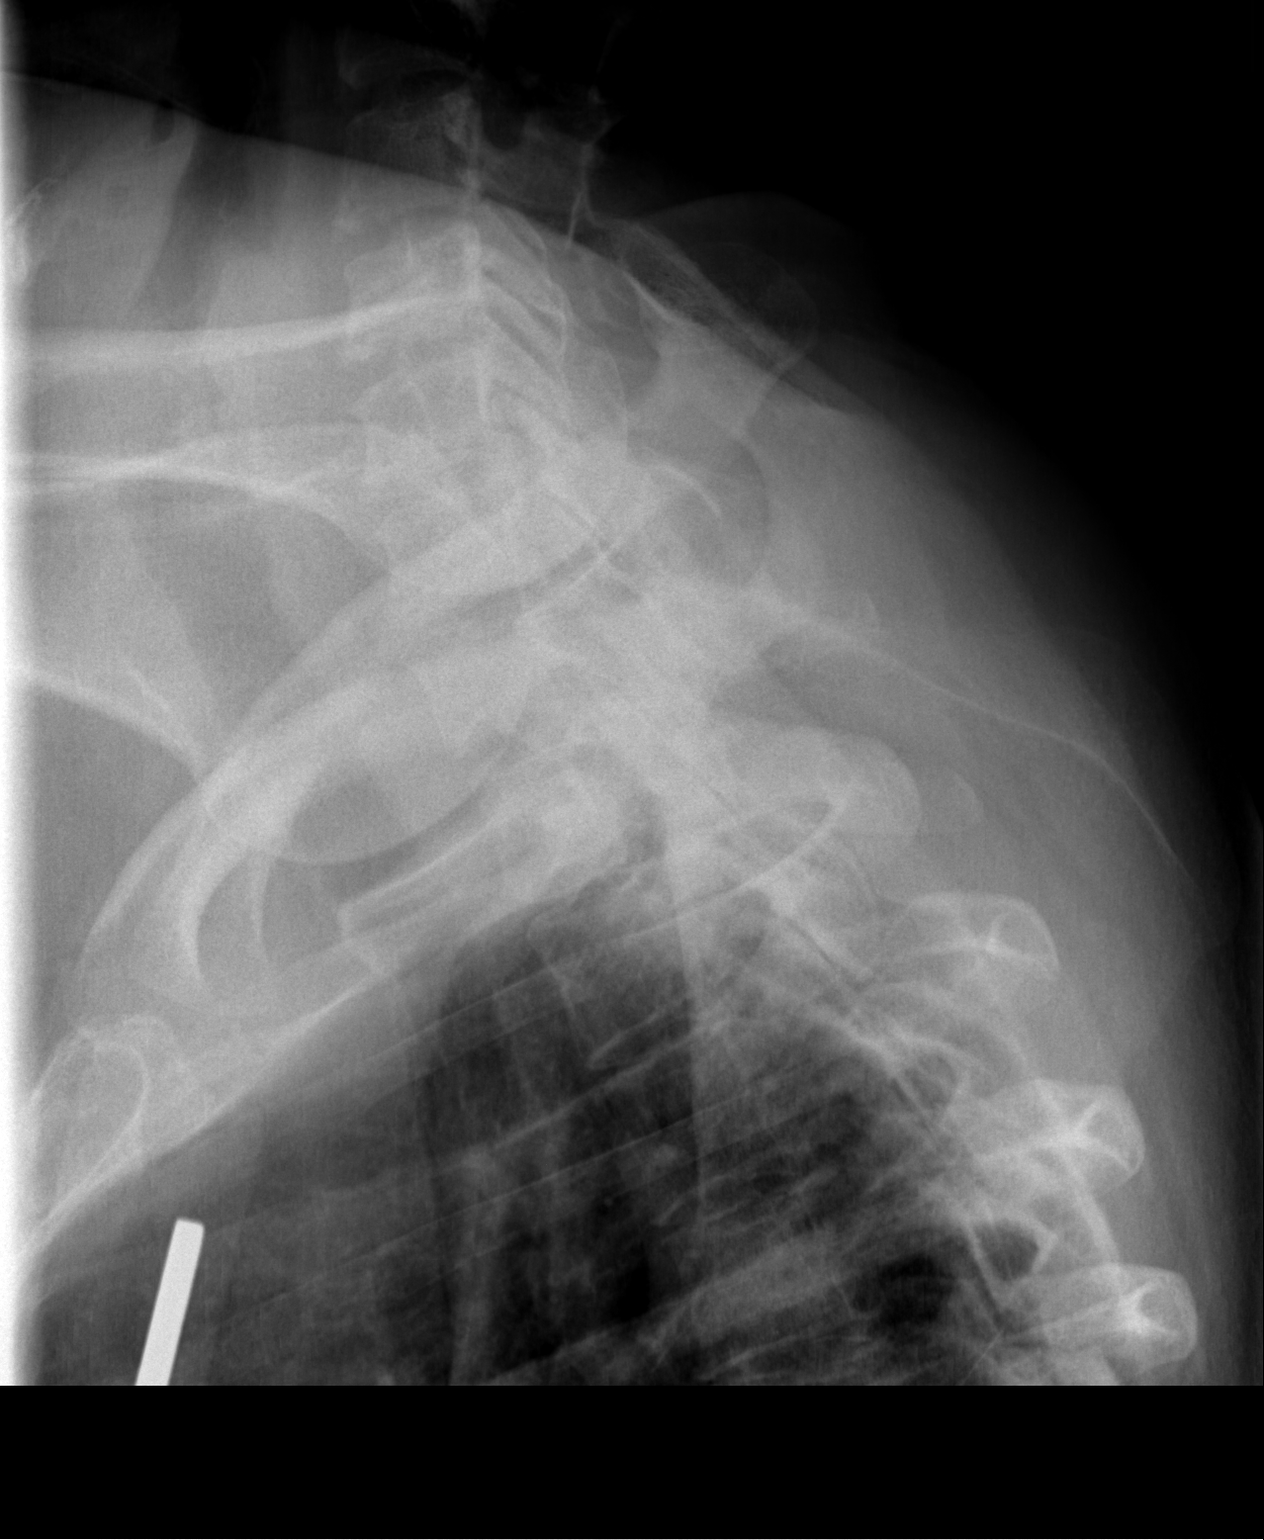

[3 of 3 positions shown; findings below may reference images not displayed]

FINDINGS: Mild degenerative changes T3-4 through T9-10.

Very mild thoracic kyphosis.

No bony destructive lesion
IMPRESSION: Mild degenerative changes T3-4 through T9-10.

Very mild thoracic kyphosis.

## 2019-12-05 ENCOUNTER — Other Ambulatory Visit: Payer: Self-pay

## 2019-12-05 ENCOUNTER — Ambulatory Visit (HOSPITAL_COMMUNITY)
Admission: EM | Admit: 2019-12-05 | Discharge: 2019-12-05 | Disposition: A | Discharge status: Home / Self Care | Payer: Self-pay | Attending: Emergency Medicine | Admitting: Emergency Medicine

## 2019-12-05 ENCOUNTER — Encounter (HOSPITAL_COMMUNITY): Payer: Self-pay

## 2019-12-05 DIAGNOSIS — M79671 Pain in right foot: Secondary | ICD-10-CM

## 2019-12-05 DIAGNOSIS — M545 Low back pain, unspecified: Secondary | ICD-10-CM

## 2019-12-05 DIAGNOSIS — B070 Plantar wart: Secondary | ICD-10-CM

## 2019-12-05 MED ORDER — TIZANIDINE HCL 4 MG PO TABS: 4.0000 mg | ORAL_TABLET | Freq: Four times a day (QID) | ORAL | 0 refills | Status: DC | PRN

## 2019-12-05 MED ORDER — PREDNISONE 10 MG PO TABS: ORAL_TABLET | ORAL | 0 refills | Status: DC

## 2019-12-05 NOTE — ED Triage Notes (Signed)
Pt presents to UC for back pain that has been present for "years". Pt also complaining for right toe pain x2 months. Pt states she has a "knot" on right great toe that is painful. Pt denies recent injury or trauma to toe. Pt states she has been treating with heating pad, Asprin, and ibuprofen, with out relief.

## 2019-12-05 NOTE — ED Provider Notes (Signed)
[facility]    CSN: [id number] Arrival date & time: 12/05/19  1014      History   Chief Complaint Chief Complaint  Patient presents with  . Back Pain  . Toe Pain    HPI Mayuri Staples is a 54 y.o. female history of hypertension, degenerative disc disease, presenting today for evaluation of back pain and foot pain.  Patient reports she has had back pain for many years, has had prior imaging which was relatively unremarkable, on chart review from 2014 appears to have degenerative joint disease noted within back.  Denies urinary symptoms.  Denies hematuria, dysuria, difficulty controlling urination/bowels.  She also reports foot pain over the past couple of months.  Has developed a growth on her foot.  Associated with pain.  Denies drainage.  Denies history of similar.  HPI  Past Medical History:  Diagnosis Date  . Hypertension     Patient Active Problem List   Diagnosis Date Noted  . Essential hypertension, benign 06/28/2013  . DJD (degenerative joint disease) 06/28/2013  . IFG (impaired fasting glucose) 06/28/2013  . Smoking 06/28/2013  . Elevated BP 04/27/2013  . Periodic health assessment, general screening, adult 04/27/2013  . Back pain 04/27/2013    History reviewed. No pertinent surgical history.  OB History   No obstetric history on file.      Home Medications    Prior to Admission medications   Medication Sig Start Date End Date Taking? Authorizing Provider  atenolol (TENORMIN) 25 MG tablet Take 1 tablet (25 mg total) by mouth daily. 06/28/13   Doris Cheadle, MD  atenolol (TENORMIN) 25 MG tablet Take 1 tablet (25 mg total) by mouth 2 (two) times daily. 04/19/14   Bethann Berkshire, MD  naproxen (NAPROSYN) 500 MG tablet Take 1 tablet (500 mg total) by mouth 2 (two) times daily with a meal. 06/28/13   Advani, Deepak, MD  nicotine (NICODERM CQ) 21 mg/24hr patch Place 1 patch (21 mg total) onto the skin daily. 06/28/13   Doris Cheadle, MD  predniSONE  (DELTASONE) 10 MG tablet Begin with 6 tabs on day 1, 5 tab on day 2, 4 tab on day 3, 3 tab on day 4, 2 tab on day 5, 1 tab on day 6-take with food 12/05/19   Tameshia Bonneville C, PA-C  tiZANidine (ZANAFLEX) 4 MG tablet Take 1 tablet (4 mg total) by mouth every 6 (six) hours as needed for muscle spasms. 12/05/19   Akesha Uresti, Junius Creamer, PA-C    Family History Family History  Problem Relation Age of Onset  . Cancer Mother   . Diabetes Mother   . Cancer Father   . Diabetes Father   . Cancer Sister   . Heart disease Sister   . Hypertension Sister   . Diabetes Sister     Social History Social History   Tobacco Use  . Smoking status: Current Every Day Smoker    Packs/day: 0.50    Years: 15.00    Pack years: 7.50    Types: Cigarettes  Substance Use Topics  . Alcohol use: Yes    Alcohol/week: 2.0 standard drinks    Types: 2 Standard drinks or equivalent per week    Comment: couple of beers   . Drug use: No     Allergies   Patient has no known allergies.   Review of Systems Review of Systems  Constitutional: Negative for fatigue and fever.  Eyes: Negative for visual disturbance.  Respiratory: Negative for shortness of  breath.   Cardiovascular: Negative for chest pain.  Gastrointestinal: Negative for abdominal pain, nausea and vomiting.  Musculoskeletal: Positive for arthralgias and myalgias. Negative for joint swelling.  Skin: Negative for color change, rash and wound.  Neurological: Negative for dizziness, weakness, light-headedness and headaches.     Physical Exam Triage Vital Signs ED Triage Vitals  Enc Vitals Group     BP 12/05/19 1035 137/90     Pulse Rate 12/05/19 1035 (!) 112     Resp 12/05/19 1035 16     Temp 12/05/19 1035 98.2 F (36.8 C)     Temp Source 12/05/19 1035 Oral     SpO2 12/05/19 1035 100 %     Weight --      Height --      Head Circumference --      Peak Flow --      Pain Score 12/05/19 1037 10     Pain Loc --      Pain Edu? --      Excl. in  GC? --    No data found.  Updated Vital Signs BP 137/90 (BP Location: Left Arm)   Pulse (!) 112   Temp 98.2 F (36.8 C) (Oral)   Resp 16   SpO2 100%   Visual Acuity Right Eye Distance:   Left Eye Distance:   Bilateral Distance:    Right Eye Near:   Left Eye Near:    Bilateral Near:     Physical Exam Vitals and nursing note reviewed.  Constitutional:      Appearance: She is well-developed.     Comments: No acute distress  HENT:     Head: Normocephalic and atraumatic.     Nose: Nose normal.  Eyes:     Conjunctiva/sclera: Conjunctivae normal.  Cardiovascular:     Rate and Rhythm: Normal rate.  Pulmonary:     Effort: Pulmonary effort is normal. No respiratory distress.  Abdominal:     General: There is no distension.  Musculoskeletal:        General: Normal range of motion.     Cervical back: Neck supple.     Comments: Diffusely tender throughout lower lumbar spine midline, no palpable deformity or step-off, diffusely tender throughout bilateral lumbar musculature  Moving all extremities appropriately  Strength 4/5 and equal bilaterally in all directions at hips knees, pain elicited with resisted hip flexion  Right foot without swelling erythema or discoloration, 0.5 cm hyperpigmented growth noted to plantar surface of foot, tender to palpation, nonsurrounding erythema Dorsalis pedis 2+  Skin:    General: Skin is warm and dry.  Neurological:     Mental Status: She is alert and oriented to person, place, and time.      UC Treatments / Results  Labs (all labs ordered are listed, but only abnormal results are displayed) Labs Reviewed - No data to display  EKG   Radiology No results found.  Procedures Procedures (including critical care time)  Medications Ordered in UC Medications - No data to display  Initial Impression / Assessment and Plan / UC Course  I have reviewed the triage vital signs and the nursing notes.  Pertinent labs & imaging results  that were available during my care of the patient were reviewed by me and considered in my medical decision making (see chart for details).     1.  Acute on chronic back pain, has known degenerative changes within spine, suspect likely continued pain related to this.  No new  mechanism of injury.  Has been using NSAIDs without relief.  Initiated on prednisone taper, tizanidine to supplement.  Alternate ice and heat.  Discussed activity modification.  No red flags for cauda equina.  2.  Foot pain suspect likely inflammatory as well as in combination with pain from plantar wart.  Recommended OTC medicine for plantar wart and would expect prednisone prescribed for back to also to help with any inflammatory cause of foot pain.  No sign of infection or cellulitis.  Discussed strict return precautions. Patient verbalized understanding and is agreeable with plan.  Final Clinical Impressions(s) / UC Diagnoses   Final diagnoses:  Acute bilateral low back pain without sciatica  Right foot pain  Plantar wart     Discharge Instructions     Back pain likely chronic from degenerative changes/arthritis noted on prior x-rays Begin prednisone taper over the next 6 days-begin with 6 tabs/60 mg on day 1, decrease by 1 tablet each day until complete-6, 5, 4, 3, 2, 1-take with food and in the morning if you are able You may use tizanidine as needed to help with pain. This is a muscle relaxer and may cause sedation- please use only at bedtime or when you will be home and not have to drive/work  May use over the counter compound W or other equivalent wart remover   Follow up if not improving or worsening   ED Prescriptions    Medication Sig Dispense Auth. Provider   predniSONE (DELTASONE) 10 MG tablet Begin with 6 tabs on day 1, 5 tab on day 2, 4 tab on day 3, 3 tab on day 4, 2 tab on day 5, 1 tab on day 6-take with food 21 tablet Dearion Huot C, PA-C   tiZANidine (ZANAFLEX) 4 MG tablet Take 1 tablet (4  mg total) by mouth every 6 (six) hours as needed for muscle spasms. 30 tablet Nader Boys, [place] C, PA-C     PDMP not reviewed this encounter.   Lew Dawes, PA-C 12/05/19 1147

## 2019-12-05 NOTE — Discharge Instructions (Addendum)
Back pain likely chronic from degenerative changes/arthritis noted on prior x-rays Begin prednisone taper over the next 6 days-begin with 6 tabs/60 mg on day 1, decrease by 1 tablet each day until complete-6, 5, 4, 3, 2, 1-take with food and in the morning if you are able You may use tizanidine as needed to help with pain. This is a muscle relaxer and may cause sedation- please use only at bedtime or when you will be home and not have to drive/work  May use over the counter compound W or other equivalent wart remover   Follow up if not improving or worsening

## 2022-05-19 DIAGNOSIS — Z419 Encounter for procedure for purposes other than remedying health state, unspecified: Secondary | ICD-10-CM | POA: Diagnosis not present

## 2022-06-06 DIAGNOSIS — M545 Low back pain, unspecified: Secondary | ICD-10-CM | POA: Diagnosis not present

## 2022-06-06 DIAGNOSIS — M62830 Muscle spasm of back: Secondary | ICD-10-CM | POA: Diagnosis not present

## 2022-06-06 DIAGNOSIS — Z7689 Persons encountering health services in other specified circumstances: Secondary | ICD-10-CM | POA: Diagnosis not present

## 2022-06-06 DIAGNOSIS — I10 Essential (primary) hypertension: Secondary | ICD-10-CM | POA: Diagnosis not present

## 2022-06-19 DIAGNOSIS — Z419 Encounter for procedure for purposes other than remedying health state, unspecified: Secondary | ICD-10-CM | POA: Diagnosis not present

## 2022-07-18 DIAGNOSIS — Z419 Encounter for procedure for purposes other than remedying health state, unspecified: Secondary | ICD-10-CM | POA: Diagnosis not present

## 2022-08-18 DIAGNOSIS — Z419 Encounter for procedure for purposes other than remedying health state, unspecified: Secondary | ICD-10-CM | POA: Diagnosis not present

## 2022-09-17 DIAGNOSIS — Z419 Encounter for procedure for purposes other than remedying health state, unspecified: Secondary | ICD-10-CM | POA: Diagnosis not present

## 2022-10-18 DIAGNOSIS — Z419 Encounter for procedure for purposes other than remedying health state, unspecified: Secondary | ICD-10-CM | POA: Diagnosis not present

## 2022-11-17 DIAGNOSIS — Z419 Encounter for procedure for purposes other than remedying health state, unspecified: Secondary | ICD-10-CM | POA: Diagnosis not present

## 2022-11-18 ENCOUNTER — Encounter: Payer: Self-pay | Admitting: Nurse Practitioner

## 2022-11-18 ENCOUNTER — Ambulatory Visit (INDEPENDENT_AMBULATORY_CARE_PROVIDER_SITE_OTHER): Payer: Medicaid Other | Admitting: Nurse Practitioner

## 2022-11-18 VITALS — BP 129/85 | HR 119 | Temp 97.0°F | Ht 66.0 in | Wt 121.0 lb

## 2022-11-18 DIAGNOSIS — H543 Unqualified visual loss, both eyes: Secondary | ICD-10-CM | POA: Diagnosis not present

## 2022-11-18 DIAGNOSIS — Z7689 Persons encountering health services in other specified circumstances: Secondary | ICD-10-CM | POA: Diagnosis not present

## 2022-11-18 DIAGNOSIS — Z720 Tobacco use: Secondary | ICD-10-CM

## 2022-11-18 DIAGNOSIS — I10 Essential (primary) hypertension: Secondary | ICD-10-CM | POA: Diagnosis not present

## 2022-11-18 DIAGNOSIS — G8929 Other chronic pain: Secondary | ICD-10-CM | POA: Diagnosis not present

## 2022-11-18 DIAGNOSIS — R002 Palpitations: Secondary | ICD-10-CM

## 2022-11-18 DIAGNOSIS — Z1231 Encounter for screening mammogram for malignant neoplasm of breast: Secondary | ICD-10-CM

## 2022-11-18 DIAGNOSIS — Z1211 Encounter for screening for malignant neoplasm of colon: Secondary | ICD-10-CM | POA: Diagnosis not present

## 2022-11-18 DIAGNOSIS — M25561 Pain in right knee: Secondary | ICD-10-CM | POA: Diagnosis not present

## 2022-11-18 HISTORY — DX: Tobacco use: Z72.0

## 2022-11-18 HISTORY — DX: Other chronic pain: G89.29

## 2022-11-18 HISTORY — DX: Palpitations: R00.2

## 2022-11-18 MED ORDER — DICLOFENAC SODIUM 1 % EX GEL: 4.0000 g | Freq: Four times a day (QID) | CUTANEOUS | 1 refills | Status: DC

## 2022-11-18 MED ORDER — ATENOLOL 25 MG PO TABS: 25.0000 mg | ORAL_TABLET | Freq: Every day | ORAL | 0 refills | Status: DC

## 2022-11-18 MED ORDER — CYCLOBENZAPRINE HCL 5 MG PO TABS: 5.0000 mg | ORAL_TABLET | Freq: Every evening | ORAL | 0 refills | Status: DC | PRN

## 2022-11-18 NOTE — Assessment & Plan Note (Signed)
BP Readings from Last 3 Encounters:  11/18/22 129/85  12/05/19 137/90  04/19/14 153/90  Patient is adamant on restarting medication today Atenolol  25 mg daily restarted due to her elevated heart rate DASH diet advised Engage in regular moderate exercises at least 250 minutes weekly as tolerated CMP today Follow-up in 4 weeks Patient encouraged to monitor blood pressure at home

## 2022-11-18 NOTE — Assessment & Plan Note (Signed)
-   diclofenac Sodium (VOLTAREN) 1 % GEL; Apply 4 g topically 4 (four) times daily.  Dispense: 50 g; Refill: 1 - cyclobenzaprine (FLEXERIL) 5 MG tablet; Take 1 tablet (5 mg total) by mouth at bedtime as needed for muscle spasms.  Dispense: 30 tablet; Refill: 0 Stretching exercises , application of heat or ice ,encouraged

## 2022-11-18 NOTE — Assessment & Plan Note (Signed)
EKG shows sinus tachycardia  rate of 112bpm possible left atrial enlargement, left ventricular hypertrophy nonspecific ST abnormality  Patient referred to cardiology Follow-up 25 mg daily filled refilled

## 2022-11-18 NOTE — Patient Instructions (Addendum)
Please get your shingles vaccine and Tdap vaccine at the pharmacy Cologuard test has been ordered to screen for colon cancer  Please get your mammogram done as discussed   1. Screening for colon cancer  - Cologuard  2. Screening mammogram for breast cancer  - MM 3D SCREENING MAMMOGRAM BILATERAL BREAST; Future  3. 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 Chronic pain of right knee  - diclofenac Sodium (VOLTAREN) 1 % GEL; Apply 4 g topically 4 (four) times daily.  Dispense: 50 g; Refill: 1 - cyclobenzaprine (FLEXERIL) 5 MG tablet; Take 1 tablet (5 mg total) by mouth at bedtime as needed for muscle spasms.  Dispense: 30 tablet; Refill: 0  5. Essential hypertension, benign  - CBC with Differential/Platelet - CMP14+EGFR - EKG 12-Lead - atenolol (TENORMIN) 25 MG tablet; Take 1 tablet (25 mg total) by mouth daily.  Dispense: 90 tablet; Refill: 0  6. Palpitations  - CMP14+EGFR - TSH - EKG 12-Lead - atenolol (TENORMIN) 25 MG tablet; Take 1 tablet (25 mg total) by mouth daily.  Dispense: 90 tablet; Refill: 0 - Ambulatory referral to Cardiology  7. Tobacco abuse  It is important that you exercise regularly at least 30 minutes 5 times a week as tolerated  Think about what you will eat, plan ahead. Choose " clean, green, fresh or frozen" over canned, processed or packaged foods which are more sugary, salty and fatty. 70 to 75% of food eaten should be vegetables and fruit. Three meals at set times with snacks allowed between meals, but they must be fruit or vegetables. Aim to eat over a 12 hour period , example 7 am to 7 pm, and STOP after  your last meal of the day. Drink water,generally about 64 ounces per day, no other drink is as healthy. Fruit juice is best enjoyed in a healthy way, by EATING the fruit.  Thanks for choosing [facility] we consider it a privelige to serve you.

## 2022-11-18 NOTE — Progress Notes (Signed)
New Patient Office Visit  Subjective:  Patient ID: Regina Daugherty, female    DOB: [birth year]  Age: 57 y.o. MRN: [id number]  CC:  Chief Complaint  Patient presents with   Follow-up    Hospital follow up. Blood pressure, muscle spasms, arthritis.    HPI Regina Daugherty is a 57 y.o. female  with past medical history of hypertension, arthritis, tobacco abuse presents for establishing care.  Has not seen a doctor in years.   Hypertension.  She was on atenolol and amlodipine in the past.  Most recent medication she used was amlodipine.  She does not check her blood pressure but states that  can tell when her blood pressure is high'' starts feeling dizzy with HA''.  She denies chest pain, edema.   Right knee pain.  Patient complains of chronic right knee pain.  Has some stiffness in the morning that eases off with walking.  Used to take Flexeril and naproxen but she ran out of both medications.  She denies numbness tingling, fever, chills  Tobacco abuse.  Smokes Black cigarettes 2-3 daily.  She denies shortness of breath, cough, wheezing.  She started smoking at age 70.  She has quit on and off in the past.   Due for mammogram and Colon cancer screening.  Mammogram and Cologuard ordered  Patient encouraged to get Tdap vaccine and shingles vaccine at the pharmacy  Plans for cervical Pap exam at next visit.    Past Medical History:  Diagnosis Date   Hypertension     History reviewed. No pertinent surgical history.  Family History  Problem Relation Age of Onset   Cancer Mother    Diabetes Mother    Cancer Father    Diabetes Father    Cancer Sister    Heart disease Sister    Hypertension Sister    Diabetes Sister     Social History   Socioeconomic History   Marital status: Single    Spouse name: Not on file   Number of children: 3   Years of education: Not on file   Highest education level: Not on file  Occupational History   Not on file  Tobacco Use   Smoking  status: Every Day    Packs/day: 0.50    Years: 15.00    Additional pack years: 0.00    Total pack years: 7.50    Types: Cigarettes   Smokeless tobacco: Not on file  Substance and Sexual Activity   Alcohol use: Yes    Alcohol/week: 2.0 standard drinks of alcohol    Types: 2 Standard drinks or equivalent per week    Comment: couple of beers    Drug use: No   Sexual activity: Not Currently  Other Topics Concern   Not on file  Social History Narrative   Lives home alone    Social Determinants of Health   Financial Resource Strain: Not on file  Food Insecurity: Not on file  Transportation Needs: Not on file  Physical Activity: Not on file  Stress: Not on file  Social Connections: Not on file  Intimate Partner Violence: Not on file    ROS Review of Systems  Constitutional:  Negative for activity change, appetite change, chills, fatigue and fever.  HENT:  Negative for congestion, dental problem, ear discharge, ear pain, hearing loss, rhinorrhea, sinus pressure, sinus pain, sneezing and sore throat.   Eyes:  Positive for visual disturbance. Negative for pain, discharge, redness and itching.  Respiratory:  Negative for cough,  chest tightness, shortness of breath and wheezing.   Cardiovascular:  Positive for palpitations. Negative for chest pain and leg swelling.  Gastrointestinal:  Negative for abdominal distention, abdominal pain, anal bleeding, blood in stool, constipation, diarrhea, nausea, rectal pain and vomiting.  Endocrine: Negative for cold intolerance, heat intolerance, polydipsia, polyphagia and polyuria.  Genitourinary:  Negative for difficulty urinating, dysuria, flank pain, frequency, hematuria, menstrual problem, pelvic pain and vaginal bleeding.  Musculoskeletal:  Positive for arthralgias. Negative for back pain, gait problem, joint swelling and myalgias.  Skin:  Negative for color change, pallor, rash and wound.  Allergic/Immunologic: Negative for environmental  allergies, food allergies and immunocompromised state.  Neurological:  Positive for dizziness and headaches. Negative for tremors, facial asymmetry and weakness.       Not currently  Hematological:  Negative for adenopathy. Does not bruise/bleed easily.  Psychiatric/Behavioral:  Negative for agitation, behavioral problems, confusion, decreased concentration, hallucinations, self-injury and suicidal ideas.     Objective:   Today's Vitals: BP 129/85   Pulse (!) 119   Temp (!) 97 F (36.1 C)   Ht 5\' 6"  (1.676 m)   Wt 121 lb (54.9 kg)   SpO2 100%   BMI 19.53 kg/m   Physical Exam Vitals and nursing note reviewed.  Constitutional:      General: She is not in acute distress.    Appearance: Normal appearance. She is not ill-appearing, toxic-appearing or diaphoretic.     Comments: Thin appearing  HENT:     Mouth/Throat:     Mouth: Mucous membranes are moist.     Pharynx: Oropharynx is clear. No oropharyngeal exudate or posterior oropharyngeal erythema.  Eyes:     General: No scleral icterus.       Right eye: No discharge.        Left eye: No discharge.     Extraocular Movements: Extraocular movements intact.     Conjunctiva/sclera: Conjunctivae normal.  Cardiovascular:     Rate and Rhythm: Normal rate and regular rhythm.     Pulses: Normal pulses.     Heart sounds: Normal heart sounds. No murmur heard.    No friction rub. No gallop.  Pulmonary:     Effort: Pulmonary effort is normal. No respiratory distress.     Breath sounds: Normal breath sounds. No stridor. No wheezing, rhonchi or rales.  Chest:     Chest wall: No tenderness.  Abdominal:     General: There is no distension.     Palpations: Abdomen is soft.     Tenderness: There is no abdominal tenderness. There is no right CVA tenderness, left CVA tenderness or guarding.  Musculoskeletal:        General: Tenderness present. No swelling, deformity or signs of injury.     Right lower leg: No edema.     Left lower leg: No  edema.     Comments: Mild tenderness on ROM of the right knee.   Skin:    General: Skin is warm and dry.     Capillary Refill: Capillary refill takes less than 2 seconds.     Coloration: Skin is not jaundiced or pale.     Findings: No bruising, erythema or lesion.  Neurological:     Mental Status: She is alert and oriented to person, place, and time.     Motor: No weakness.     Coordination: Coordination normal.     Gait: Gait normal.  Psychiatric:        Mood and Affect: Mood normal.  Behavior: Behavior normal.        Thought Content: Thought content normal.        Judgment: Judgment normal.     Assessment & Plan:   Problem List Items Addressed This Visit       Cardiovascular and Mediastinum   Essential hypertension, benign    BP Readings from Last 3 Encounters:  11/18/22 129/85  12/05/19 137/90  04/19/14 153/90  Patient is adamant on restarting medication today Atenolol  25 mg daily restarted due to her elevated heart rate DASH diet advised Engage in regular moderate exercises at least 250 minutes weekly as tolerated CMP today Follow-up in 4 weeks Patient encouraged to monitor blood pressure at home      Relevant Medications   atenolol (TENORMIN) 25 MG tablet   Other Relevant Orders   CBC with Differential/Platelet   CMP14+EGFR   EKG 12-Lead     Other   Screening for colon cancer   Relevant Orders   Cologuard   Screening mammogram for breast cancer   Relevant Orders   MM 3D SCREENING MAMMOGRAM BILATERAL BREAST   Tobacco abuse    Smokes about 2-3 cigars daily day  Asked about quitting: confirms that he/she currently smokes cigarettes Advise to quit smoking: Educated about QUITTING to reduce the risk of cancer, cardio and cerebrovascular disease. Assess willingness: Unwilling to quit at this time, but is working on cutting back. Assist with counseling and pharmacotherapy: Counseled for 5 minutes and literature provided. Arrange for follow up: follow  up in 1 month and continue to offer help.       Chronic pain of right knee     - diclofenac Sodium (VOLTAREN) 1 % GEL; Apply 4 g topically 4 (four) times daily.  Dispense: 50 g; Refill: 1 - cyclobenzaprine (FLEXERIL) 5 MG tablet; Take 1 tablet (5 mg total) by mouth at bedtime as needed for muscle spasms.  Dispense: 30 tablet; Refill: 0 Stretching exercises , application of heat or ice ,encouraged      Relevant Medications   diclofenac Sodium (VOLTAREN) 1 % GEL   cyclobenzaprine (FLEXERIL) 5 MG tablet   Palpitations - Primary    EKG shows sinus tachycardia  rate of 112bpm possible left atrial enlargement, left ventricular hypertrophy nonspecific ST abnormality  Patient referred to cardiology Follow-up 25 mg daily filled refilled      Relevant Medications   atenolol (TENORMIN) 25 MG tablet   Other Relevant Orders   CMP14+EGFR   TSH   EKG 12-Lead   Ambulatory referral to Cardiology   Other Visit Diagnoses     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   cyclobenzaprine (FLEXERIL) 5 MG tablet Take 1 tablet (5 mg total) by mouth at bedtime as needed for muscle spasms.   diclofenac Sodium (VOLTAREN) 1 % GEL Apply 4 g topically 4 (four) times daily.   [DISCONTINUED] amLODipine (NORVASC) 2.5 MG tablet Take 2.5 mg by mouth daily.   [DISCONTINUED] cyclobenzaprine (FLEXERIL) 5 MG tablet Take 5 mg by mouth 3 (three) times daily as needed.   [DISCONTINUED] naproxen (NAPROSYN) 500 MG tablet Take 1 tablet (500 mg total) by mouth 2 (two) times daily with a meal.   nicotine (NICODERM CQ) 21 mg/24hr patch Place 1 patch (  21 mg total) onto the skin daily. (Patient not taking: Reported on 11/18/2022)   [DISCONTINUED] atenolol (TENORMIN) 25 MG tablet Take 1 tablet (25 mg total) by mouth daily. (Patient  not taking: Reported on 11/18/2022)   [DISCONTINUED] atenolol (TENORMIN) 25 MG tablet Take 1 tablet (25 mg total) by mouth 2 (two) times daily. (Patient not taking: Reported on 11/18/2022)   [DISCONTINUED] predniSONE (DELTASONE) 10 MG tablet Begin with 6 tabs on day 1, 5 tab on day 2, 4 tab on day 3, 3 tab on day 4, 2 tab on day 5, 1 tab on day 6-take with food (Patient not taking: Reported on 11/18/2022)   [DISCONTINUED] tiZANidine (ZANAFLEX) 4 MG tablet Take 1 tablet (4 mg total) by mouth every 6 (six) hours as needed for muscle spasms. (Patient not taking: Reported on 11/18/2022)   No facility-administered encounter medications on file as of 11/18/2022.    Follow-up: Return in about 4 weeks (around 12/16/2022) for CPE.   Donell Beers, FNP

## 2022-11-18 NOTE — Assessment & Plan Note (Signed)
Smokes about 2-3 cigars daily day  Asked about quitting: confirms that he/she currently smokes cigarettes Advise to quit smoking: Educated about QUITTING to reduce the risk of cancer, cardio and cerebrovascular disease. Assess willingness: Unwilling to quit at this time, but is working on cutting back. Assist with counseling and pharmacotherapy: Counseled for 5 minutes and literature provided. Arrange for follow up: follow up in 1 month and continue to offer help.

## 2022-11-19 LAB — CBC WITH DIFFERENTIAL/PLATELET
Basophils Absolute: 0.1 10*3/uL (ref 0.0–0.2)
Basos: 1 %
EOS (ABSOLUTE): 0.1 10*3/uL (ref 0.0–0.4)
Eos: 1 %
Hematocrit: 47.8 % — ABNORMAL HIGH (ref 34.0–46.6)
Hemoglobin: 14.5 g/dL (ref 11.1–15.9)
Immature Grans (Abs): 0 10*3/uL (ref 0.0–0.1)
Immature Granulocytes: 0 %
Lymphocytes Absolute: 2.9 10*3/uL (ref 0.7–3.1)
Lymphs: 38 %
MCH: 22 pg — ABNORMAL LOW (ref 26.6–33.0)
MCHC: 30.3 g/dL — ABNORMAL LOW (ref 31.5–35.7)
MCV: 72 fL — ABNORMAL LOW (ref 79–97)
Monocytes Absolute: 0.8 10*3/uL (ref 0.1–0.9)
Monocytes: 11 %
Neutrophils Absolute: 3.6 10*3/uL (ref 1.4–7.0)
Neutrophils: 49 %
Platelets: 283 10*3/uL (ref 150–450)
RBC: 6.6 x10E6/uL — ABNORMAL HIGH (ref 3.77–5.28)
RDW: 18.9 % — ABNORMAL HIGH (ref 11.7–15.4)
WBC: 7.5 10*3/uL (ref 3.4–10.8)

## 2022-11-19 LAB — CMP14+EGFR
ALT: 15 IU/L (ref 0–32)
AST: 26 IU/L (ref 0–40)
Albumin: 4.8 g/dL (ref 3.8–4.9)
Alkaline Phosphatase: 85 IU/L (ref 44–121)
BUN/Creatinine Ratio: 14 (ref 9–23)
BUN: 10 mg/dL (ref 6–24)
Bilirubin Total: 0.5 mg/dL (ref 0.0–1.2)
CO2: 23 mmol/L (ref 20–29)
Calcium: 10.3 mg/dL — ABNORMAL HIGH (ref 8.7–10.2)
Chloride: 94 mmol/L — ABNORMAL LOW (ref 96–106)
Creatinine, Ser: 0.73 mg/dL (ref 0.57–1.00)
Globulin, Total: 3 g/dL (ref 1.5–4.5)
Glucose: 113 mg/dL — ABNORMAL HIGH (ref 70–99)
Potassium: 3.6 mmol/L (ref 3.5–5.2)
Sodium: 135 mmol/L (ref 134–144)
Total Protein: 7.8 g/dL (ref 6.0–8.5)
eGFR: 95 mL/min/{1.73_m2} (ref >59)

## 2022-11-19 LAB — TSH: TSH: 1.4 u[IU]/mL (ref 0.450–4.500)

## 2022-11-24 ENCOUNTER — Ambulatory Visit: Admission: RE | Admit: 2022-11-24 | Payer: Medicaid Other | Source: Ambulatory Visit

## 2022-11-24 ENCOUNTER — Other Ambulatory Visit: Payer: Self-pay | Admitting: Nurse Practitioner

## 2022-11-24 DIAGNOSIS — Z1231 Encounter for screening mammogram for malignant neoplasm of breast: Secondary | ICD-10-CM | POA: Diagnosis not present

## 2022-11-24 DIAGNOSIS — G8929 Other chronic pain: Secondary | ICD-10-CM

## 2022-11-24 DIAGNOSIS — Z7689 Persons encountering health services in other specified circumstances: Secondary | ICD-10-CM | POA: Diagnosis not present

## 2022-11-24 DIAGNOSIS — I10 Essential (primary) hypertension: Secondary | ICD-10-CM

## 2022-11-24 DIAGNOSIS — R002 Palpitations: Secondary | ICD-10-CM

## 2022-11-27 ENCOUNTER — Other Ambulatory Visit: Payer: Self-pay | Admitting: Nurse Practitioner

## 2022-11-27 DIAGNOSIS — R928 Other abnormal and inconclusive findings on diagnostic imaging of breast: Secondary | ICD-10-CM

## 2022-12-01 DIAGNOSIS — Z1211 Encounter for screening for malignant neoplasm of colon: Secondary | ICD-10-CM | POA: Diagnosis not present

## 2022-12-02 ENCOUNTER — Other Ambulatory Visit: Payer: Medicaid Other

## 2022-12-08 ENCOUNTER — Ambulatory Visit
Admission: RE | Admit: 2022-12-08 | Discharge: 2022-12-08 | Disposition: A | Discharge status: Home / Self Care | Payer: Medicaid Other | Source: Ambulatory Visit | Attending: Nurse Practitioner | Admitting: Nurse Practitioner

## 2022-12-08 DIAGNOSIS — R928 Other abnormal and inconclusive findings on diagnostic imaging of breast: Secondary | ICD-10-CM | POA: Diagnosis not present

## 2022-12-08 DIAGNOSIS — Z7689 Persons encountering health services in other specified circumstances: Secondary | ICD-10-CM | POA: Diagnosis not present

## 2022-12-08 LAB — COLOGUARD: COLOGUARD: NEGATIVE

## 2022-12-16 ENCOUNTER — Other Ambulatory Visit (HOSPITAL_COMMUNITY)
Admission: RE | Admit: 2022-12-16 | Discharge: 2022-12-16 | Disposition: A | Discharge status: Home / Self Care | Payer: Medicaid Other | Source: Ambulatory Visit | Attending: Nurse Practitioner | Admitting: Nurse Practitioner

## 2022-12-16 ENCOUNTER — Encounter: Payer: Self-pay | Admitting: Nurse Practitioner

## 2022-12-16 ENCOUNTER — Ambulatory Visit (INDEPENDENT_AMBULATORY_CARE_PROVIDER_SITE_OTHER): Payer: Medicaid Other | Admitting: Nurse Practitioner

## 2022-12-16 VITALS — BP 120/78 | HR 93 | Temp 97.0°F | Ht 66.0 in | Wt 120.6 lb

## 2022-12-16 DIAGNOSIS — Z124 Encounter for screening for malignant neoplasm of cervix: Secondary | ICD-10-CM

## 2022-12-16 DIAGNOSIS — Z7689 Persons encountering health services in other specified circumstances: Secondary | ICD-10-CM | POA: Diagnosis not present

## 2022-12-16 DIAGNOSIS — Z1329 Encounter for screening for other suspected endocrine disorder: Secondary | ICD-10-CM | POA: Diagnosis not present

## 2022-12-16 DIAGNOSIS — G8929 Other chronic pain: Secondary | ICD-10-CM

## 2022-12-16 DIAGNOSIS — M25561 Pain in right knee: Secondary | ICD-10-CM | POA: Diagnosis not present

## 2022-12-16 DIAGNOSIS — I10 Essential (primary) hypertension: Secondary | ICD-10-CM | POA: Diagnosis not present

## 2022-12-16 DIAGNOSIS — Z1321 Encounter for screening for nutritional disorder: Secondary | ICD-10-CM

## 2022-12-16 DIAGNOSIS — R002 Palpitations: Secondary | ICD-10-CM | POA: Diagnosis not present

## 2022-12-16 DIAGNOSIS — H6121 Impacted cerumen, right ear: Secondary | ICD-10-CM

## 2022-12-16 DIAGNOSIS — Z0000 Encounter for general adult medical examination without abnormal findings: Secondary | ICD-10-CM

## 2022-12-16 DIAGNOSIS — Z130 Encounter for screening for diseases of the blood and blood-forming organs and certain disorders involving the immune mechanism: Secondary | ICD-10-CM | POA: Diagnosis not present

## 2022-12-16 DIAGNOSIS — Z13228 Encounter for screening for other metabolic disorders: Secondary | ICD-10-CM

## 2022-12-16 DIAGNOSIS — Z720 Tobacco use: Secondary | ICD-10-CM

## 2022-12-16 MED ORDER — ATENOLOL 25 MG PO TABS: 25.0000 mg | ORAL_TABLET | Freq: Every day | ORAL | 1 refills | Status: DC

## 2022-12-16 MED ORDER — DEBROX 6.5 % OT SOLN: 5.0000 [drp] | Freq: Two times a day (BID) | OTIC | 0 refills | Status: DC

## 2022-12-16 NOTE — Assessment & Plan Note (Signed)
Heart rate is within normal range in the office today Continue atenolol 25 mg daily and follow-up with cardiology as planned

## 2022-12-16 NOTE — Assessment & Plan Note (Signed)
Annual exam as documented.  Counseling done include healthy lifestyle involving committing to 150 minutes of exercise per week, heart healthy diet, and attaining healthy weight. The importance of adequate sleep also discussed.  Regular use of seat belt and home safety were also discussed . Changes in health habits are decided on by patient with goals and time frames set for achieving them. Immunization and cancer screening  needs are specifically addressed at this visit.   Up-to-date with colon cancer screening, breast cancer screening.  Pap smear completed today Encouraged to get shingles vaccine and Tdap vaccines at the pharmacy Routine fasting labs ordered

## 2022-12-16 NOTE — Progress Notes (Signed)
Complete physical exam  Patient: Regina Daugherty   DOB: [birth year]   57 y.o. Female  MRN: [id number]  Subjective:    Chief Complaint  Patient presents with   Follow-up   Gynecologic Exam    Tamana Wissmann is a 57 y.o. female who presents today for a complete physical exam. She reports consuming a general diet. Walsk up and down the stairs in her home for excsrcises  She generally feels well.   Has Cardiology appointment on 02/18/2023.  Trying to find an eye doctor one that will take her insurance.  Patient encouraged to get her Tdap vaccine and shingles vaccines at the pharmacy  Most recent fall risk assessment:    11/18/2022    9:10 AM  Fall Risk   Falls in the past year? 0  Number falls in past yr: 0  Injury with Fall? 0  Risk for fall due to : No Fall Risks  Follow up Falls evaluation completed     Most recent depression screenings:    12/16/2022    9:24 AM 11/18/2022    9:11 AM  PHQ 2/9 Scores  PHQ - 2 Score 0 0  PHQ- 9 Score 0 0        Patient Care Team: Donell Beers, FNP as PCP - General (Nurse Practitioner)   Outpatient Medications Prior to Visit  Medication Sig   cyclobenzaprine (FLEXERIL) 5 MG tablet Take 1 tablet (5 mg total) by mouth at bedtime as needed for muscle spasms.   diclofenac Sodium (VOLTAREN) 1 % GEL Apply 4 g topically 4 (four) times daily.   [DISCONTINUED] atenolol (TENORMIN) 25 MG tablet Take 1 tablet (25 mg total) by mouth daily.   nicotine (NICODERM CQ) 21 mg/24hr patch Place 1 patch (21 mg total) onto the skin daily. (Patient not taking: Reported on 11/18/2022)   No facility-administered medications prior to visit.    Review of Systems  Constitutional:  Negative for chills, diaphoresis, fatigue and fever.  HENT:  Negative for congestion, ear discharge and ear pain.   Eyes:  Positive for visual disturbance.  Respiratory:  Negative for apnea, cough, choking, chest tightness and shortness of breath.   Cardiovascular: Negative.   Negative for chest pain, palpitations and leg swelling.  Gastrointestinal: Negative.  Negative for abdominal distention, abdominal pain, anal bleeding and blood in stool.  Endocrine: Negative.  Negative for cold intolerance, heat intolerance, polydipsia and polyphagia.  Genitourinary: Negative.  Negative for difficulty urinating, dyspareunia, dysuria and enuresis.  Musculoskeletal:  Positive for arthralgias. Negative for myalgias, neck pain and neck stiffness.  Skin: Negative.   Neurological:  Negative for dizziness, seizures, syncope and speech difficulty.  Psychiatric/Behavioral:  Negative for agitation, behavioral problems, confusion, self-injury and suicidal ideas.        Objective:     BP 120/78   Pulse 93   Temp (!) 97 F (36.1 C)   Ht 5\' 6"  (1.676 m)   Wt 120 lb 9.6 oz (54.7 kg)   SpO2 98%   BMI 19.47 kg/m    Physical Exam Vitals and nursing note reviewed. Exam conducted with a chaperone present.  Constitutional:      General: She is not in acute distress.    Appearance: Normal appearance. She is not ill-appearing, toxic-appearing or diaphoretic.  HENT:     Right Ear: Tympanic membrane, ear canal and external ear normal. There is impacted cerumen.     Left Ear: Tympanic membrane, ear canal and external ear normal. There is no  impacted cerumen.     Nose: Nose normal. No congestion or rhinorrhea.     Mouth/Throat:     Mouth: Mucous membranes are moist.     Pharynx: Oropharynx is clear. No oropharyngeal exudate or posterior oropharyngeal erythema.  Eyes:     General: No scleral icterus.       Right eye: No discharge.        Left eye: No discharge.     Extraocular Movements: Extraocular movements intact.     Conjunctiva/sclera: Conjunctivae normal.  Neck:     Vascular: No carotid bruit.  Cardiovascular:     Rate and Rhythm: Normal rate and regular rhythm.     Pulses: Normal pulses.     Heart sounds: Normal heart sounds. No murmur heard.    No friction rub. No  gallop.  Pulmonary:     Effort: Pulmonary effort is normal. No respiratory distress.     Breath sounds: Normal breath sounds. No stridor. No wheezing, rhonchi or rales.  Chest:     Chest wall: No tenderness.  Abdominal:     General: Bowel sounds are normal. There is no distension.     Palpations: Abdomen is soft. There is no mass.     Tenderness: There is no abdominal tenderness. There is no right CVA tenderness, left CVA tenderness, guarding or rebound.     Hernia: No hernia is present. There is no hernia in the left inguinal area or right inguinal area.  Genitourinary:    Pubic Area: No rash or pubic lice.      Tanner stage (genital): 5.     Labia:        Right: No rash, tenderness, lesion or injury.        Left: No rash, tenderness, lesion or injury.      Urethra: No prolapse, urethral pain, urethral swelling or urethral lesion.     Vagina: No signs of injury and foreign body. No vaginal discharge, erythema, tenderness, bleeding, lesions or prolapsed vaginal walls.     Cervix: No cervical motion tenderness, discharge, friability, lesion, erythema, cervical bleeding or eversion.     Uterus: Not deviated, not enlarged, not fixed, not tender and no uterine prolapse.      Adnexa:        Right: No mass, tenderness or fullness.         Left: No mass, tenderness or fullness.    Musculoskeletal:        General: No swelling, tenderness, deformity or signs of injury.     Cervical back: Normal range of motion and neck supple. No rigidity or tenderness.     Right lower leg: No edema.     Left lower leg: No edema.  Lymphadenopathy:     Cervical: No cervical adenopathy.     Lower Body: No right inguinal adenopathy. No left inguinal adenopathy.  Skin:    General: Skin is warm and dry.     Capillary Refill: Capillary refill takes less than 2 seconds.     Coloration: Skin is not jaundiced or pale.     Findings: No bruising, erythema, lesion or rash.  Neurological:     Mental Status: She is  alert and oriented to person, place, and time.     Cranial Nerves: No cranial nerve deficit.     Sensory: No sensory deficit.     Motor: No weakness.     Coordination: Coordination normal.     Gait: Gait normal.     Deep Tendon  Reflexes: Reflexes normal.  Psychiatric:        Mood and Affect: Mood normal.        Behavior: Behavior normal.        Thought Content: Thought content normal.        Judgment: Judgment normal.     No results found for any visits on 12/16/22.     Assessment & Plan:    Routine Health Maintenance and Physical Exam   There is no immunization history on file for this patient.  Health Maintenance  Topic Date Due   HIV Screening  Never done   Hepatitis C Screening  Never done   DTaP/Tdap/Td (1 - Tdap) Never done   PAP SMEAR-Modifier  Never done   Zoster Vaccines- Shingrix (1 of 2) Never done   COVID-19 Vaccine (1 - 2023-24 season) Never done   INFLUENZA VACCINE  12/18/2022   MAMMOGRAM  11/23/2024   Fecal DNA (Cologuard)  11/30/2025   HPV VACCINES  Aged Out    Discussed health benefits of physical activity, and encouraged her to engage in regular exercise appropriate for her age and condition.  Problem List Items Addressed This Visit       Cardiovascular and Mediastinum   Essential hypertension, benign    BP Readings from Last 3 Encounters:  12/16/22 120/78  11/18/22 129/85  12/05/19 137/90   HTN Controlled .  On atenolol 25 mg daily Continue current medications. No changes in management. Discussed DASH diet and dietary sodium restrictions Continue to increase dietary efforts and exercise.         Relevant Medications   atenolol (TENORMIN) 25 MG tablet     Other   Tobacco abuse    Continues to smoke 2 to 3 cigarettes daily smoking cessation encouraged      Chronic pain of right knee    No complaints today, Voltaren gel has been helping      Palpitations    Heart rate is within normal range in the office today Continue atenolol  25 mg daily and follow-up with cardiology as planned      Relevant Medications   atenolol (TENORMIN) 25 MG tablet   Other Relevant Orders   Cytology - PAP([place])   Screening for endocrine, nutritional, metabolic and immunity disorder   Relevant Orders   Hemoglobin A1c   Hepatitis C antibody   HIV Antibody (routine testing w rflx)   Lipid panel   Annual physical exam - Primary    Annual exam as documented.  Counseling done include healthy lifestyle involving committing to 150 minutes of exercise per week, heart healthy diet, and attaining healthy weight. The importance of adequate sleep also discussed.  Regular use of seat belt and home safety were also discussed . Changes in health habits are decided on by patient with goals and time frames set for achieving them. Immunization and cancer screening  needs are specifically addressed at this visit.   Up-to-date with colon cancer screening, breast cancer screening.  Pap smear completed today Encouraged to get shingles vaccine and Tdap vaccines at the pharmacy Routine fasting labs ordered      Other Visit Diagnoses     Screening for cervical cancer       Relevant Orders   Cytology - PAP([place])      Return in about 4 months (around 04/18/2023) for HTN, FASTING LABS THIS WEEK.     Donell Beers, FNP

## 2022-12-16 NOTE — Patient Instructions (Addendum)
Please get your shingles vaccine and Tdap vaccines at the pharmacy.   It is important that you exercise regularly at least 30 minutes 5 times a week as tolerated  Think about what you will eat, plan ahead. Choose " clean, green, fresh or frozen" over canned, processed or packaged foods which are more sugary, salty and fatty. 70 to 75% of food eaten should be vegetables and fruit. Three meals at set times with snacks allowed between meals, but they must be fruit or vegetables. Aim to eat over a 12 hour period , example 7 am to 7 pm, and STOP after  your last meal of the day. Drink water,generally about 64 ounces per day, no other drink is as healthy. Fruit juice is best enjoyed in a healthy way, by EATING the fruit.  Thanks for choosing [facility] we consider it a privelige to serve you.

## 2022-12-16 NOTE — Assessment & Plan Note (Signed)
No complaints today, Voltaren gel has been helping

## 2022-12-16 NOTE — Assessment & Plan Note (Signed)
BP Readings from Last 3 Encounters:  12/16/22 120/78  11/18/22 129/85  12/05/19 137/90   HTN Controlled .  On atenolol 25 mg daily Continue current medications. No changes in management. Discussed DASH diet and dietary sodium restrictions Continue to increase dietary efforts and exercise.

## 2022-12-16 NOTE — Assessment & Plan Note (Addendum)
Continues to smoke 2 to 3 cigarettes daily smoking cessation encouraged

## 2022-12-18 DIAGNOSIS — Z419 Encounter for procedure for purposes other than remedying health state, unspecified: Secondary | ICD-10-CM | POA: Diagnosis not present

## 2022-12-23 ENCOUNTER — Other Ambulatory Visit: Payer: Medicaid Other

## 2022-12-23 DIAGNOSIS — Z7689 Persons encountering health services in other specified circumstances: Secondary | ICD-10-CM | POA: Diagnosis not present

## 2022-12-23 DIAGNOSIS — Z130 Encounter for screening for diseases of the blood and blood-forming organs and certain disorders involving the immune mechanism: Secondary | ICD-10-CM | POA: Diagnosis not present

## 2022-12-23 DIAGNOSIS — Z1321 Encounter for screening for nutritional disorder: Secondary | ICD-10-CM | POA: Diagnosis not present

## 2022-12-23 DIAGNOSIS — Z1329 Encounter for screening for other suspected endocrine disorder: Secondary | ICD-10-CM | POA: Diagnosis not present

## 2022-12-23 DIAGNOSIS — Z13228 Encounter for screening for other metabolic disorders: Secondary | ICD-10-CM | POA: Diagnosis not present

## 2023-01-18 DIAGNOSIS — Z419 Encounter for procedure for purposes other than remedying health state, unspecified: Secondary | ICD-10-CM | POA: Diagnosis not present

## 2023-01-23 ENCOUNTER — Telehealth: Payer: Self-pay

## 2023-01-23 NOTE — Telephone Encounter (Signed)
ERROR

## 2023-02-17 DIAGNOSIS — Z419 Encounter for procedure for purposes other than remedying health state, unspecified: Secondary | ICD-10-CM | POA: Diagnosis not present

## 2023-02-17 NOTE — Progress Notes (Unsigned)
Cardiology Office Note:   Date:  02/18/2023  ID:  Regina Daugherty, DOB [birth year], MRN [id number] PCP:  Regina Beers, FNP  [facility] Providers Cardiologist:  Regina Skeans, MD Referring MD: Regina Beers, FNP   Chief Complaint/Reason for Referral: Palpitations ASSESSMENT:    1. Palpitations   2. Tobacco abuse   3. Primary hypertension     PLAN:   In order of problems listed above: 1.  Palpitations:  Will obtain an echocardiogram and monitor to evaluate further.  These have resolved with resumption of the patient's atenolol. 2.  Tobacco abuse: Being managed by PCP; I stressed the need for abstinence.  On a NicoDerm patch. 3.  Hypertension: Will start chlorthalidone 25 mg and check BMP next week.  1 week nurse visit for blood pressure check.  Will obtain echocardiogram.      Dispo: 3 months     Medication Adjustments/Labs and Tests Ordered: Current medicines are reviewed at length with the patient today.  Concerns regarding medicines are outlined above.  The following changes have been made:     Labs/tests ordered: Orders Placed This Encounter  Procedures   Basic metabolic panel   EKG 12-Lead   ECHOCARDIOGRAM COMPLETE    Medication Changes: Meds ordered this encounter  Medications   chlorthalidone (HYGROTON) 25 MG tablet    Sig: Take 1 tablet (25 mg total) by mouth daily.    Dispense:  90 tablet    Refill:  3    Current medicines are reviewed at length with the patient today.  The patient does not have concerns regarding medicines.  History of Present Illness:    FOCUSED PROBLEM LIST:   Hypertension Tobacco abuse  The patient is a 57 y.o. female with the indicated medical history here for recommendations regarding palpitations.  The patient was seen by her primary care provider in July.  At that point in time she was complaining of dizziness.  An EKG at that time demonstrated left ventricular hypertrophy and possible left atrial enlargement.   For this reason she was referred to cardiology.  Labs at that time including a TSH, CMP, and CBC were within normal limits.    The patient tells me that her palpitations have resolved.  She tells me that this was due to the fact she was taking her blood pressure medications.  She tells me she is doing well.  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  She denies any exertional angina, exertional dyspnea, presyncope, palpitations, paroxysmal nocturnal dyspnea, orthopnea.  She does continue to smoke however she is down to about 6 cigarettes a week.  She denies any claudication symptoms.  She is trying to quit and is on a NicoDerm patch.     Current Medications: Current Meds  Medication Sig   atenolol (TENORMIN) 25 MG tablet Take 1 tablet (25 mg total) by mouth daily.   chlorthalidone (HYGROTON) 25 MG tablet Take 1 tablet (25 mg total) by mouth daily.   cyclobenzaprine (FLEXERIL) 5 MG tablet Take 1 tablet (5 mg total) by mouth at bedtime as needed for muscle spasms.   diclofenac Sodium (VOLTAREN) 1 % GEL Apply 4 g topically 4 (four) times daily.      Review of Systems:   Please see the history of present illness.    All other systems reviewed and are negative.      EKGs/Labs/Other Test Reviewed:   EKG:    EKG Interpretation Date/Time:  Wednesday February 18 2023 09:17:51 EDT Ventricular Rate:  87 PR Interval:  178 QRS Duration:  92 QT Interval:  398 QTC Calculation: 478 R Axis:   59  Text Interpretation: Normal sinus rhythm Possible Left atrial enlargement No previous ECGs available Confirmed by Regina Daugherty ([id number]) on 02/18/2023 9:27:31 AM        RELEVANT IMAGING: None  Risk Assessment/Calculations:          Physical Exam:   VS:  BP (!) 160/80   Pulse 87   Ht 5\' 6"  (1.676 m)   Wt 116 lb (52.6 kg)   BMI 18.72 kg/m    HYPERTENSION CONTROL Vitals:   02/18/23 0920 02/18/23 0937  BP: (!) 168/102 (!) 160/80    The patient's blood  pressure is elevated above target today.  In order to address the patient's elevated BP: A new medication was prescribed today.; A return visit for a blood pressure check with the RN or CMA will be arranged.      Wt Readings from Last 3 Encounters:  02/18/23 116 lb (52.6 kg)  12/16/22 120 lb 9.6 oz (54.7 kg)  11/18/22 121 lb (54.9 kg)      GENERAL:  No apparent distress, AOx3 HEENT:  No carotid bruits, +2 carotid impulses, no scleral icterus CAR: RRR no murmurs, gallops, rubs, or thrills RES:  Clear to auscultation bilaterally ABD:  Soft, nontender, nondistended, positive bowel sounds x 4 VASC:  +2 radial pulses, +2 carotid pulses NEURO:  CN 2-12 grossly intact; motor and sensory grossly intact PSYCH:  No active depression or anxiety EXT:  No edema, ecchymosis, or cyanosis  Signed, Regina Pyo, MD  02/18/2023 9:41 AM    [facility] [address] [place], [place], [place]  [address] Phone: [phone]; Fax: [phone]   Note:  This document was prepared using Dragon voice recognition software and may include unintentional dictation errors.

## 2023-02-18 ENCOUNTER — Encounter: Payer: Self-pay | Admitting: Internal Medicine

## 2023-02-18 ENCOUNTER — Ambulatory Visit: Payer: Medicaid Other | Attending: Internal Medicine | Admitting: Internal Medicine

## 2023-02-18 VITALS — BP 160/80 | HR 87 | Ht 66.0 in | Wt 116.0 lb

## 2023-02-18 DIAGNOSIS — R002 Palpitations: Secondary | ICD-10-CM

## 2023-02-18 DIAGNOSIS — Z720 Tobacco use: Secondary | ICD-10-CM | POA: Diagnosis not present

## 2023-02-18 DIAGNOSIS — I10 Essential (primary) hypertension: Secondary | ICD-10-CM

## 2023-02-18 DIAGNOSIS — Z7689 Persons encountering health services in other specified circumstances: Secondary | ICD-10-CM | POA: Diagnosis not present

## 2023-02-18 MED ORDER — CHLORTHALIDONE 25 MG PO TABS: 25.0000 mg | ORAL_TABLET | Freq: Every day | ORAL | 3 refills | Status: DC

## 2023-02-18 NOTE — Patient Instructions (Signed)
Medication Instructions:  Your physician has recommended you make the following change in your medication:   1) START chlorthalidone 25 mg once daily  *If you need a refill on your cardiac medications before your next appointment, please call your pharmacy*  Lab Work: In 1 week (same day as nurse visit): BMP If you have labs (blood work) drawn today and your tests are completely normal, you will receive your results only by: MyChart Message (if you have MyChart) OR A paper copy in the mail If you have any lab test that is abnormal or we need to change your treatment, we will call you to review the results.   Testing/Procedures: Your physician has requested that you have an echocardiogram. Echocardiography is a painless test that uses sound waves to create images of your heart. It provides your doctor with information about the size and shape of your heart and how well your heart's chambers and valves are working. This procedure takes approximately one hour. There are no restrictions for this procedure. Please do NOT wear cologne, perfume, aftershave, or lotions (deodorant is allowed). Please arrive 15 minutes prior to your appointment time.  Follow-Up: At [facility], you and your health needs are our priority.  As part of our continuing mission to provide you with exceptional heart care, we have created designated Provider Care Teams.  These Care Teams include your primary Cardiologist (physician) and Advanced Practice Providers (APPs -  Physician Assistants and Nurse Practitioners) who all work together to provide you with the care you need, when you need it.  We recommend signing up for the patient portal called "MyChart".  Sign up information is provided on this After Visit Summary.  MyChart is used to connect with patients for Virtual Visits (Telemedicine).  Patients are able to view lab/test results, encounter notes, upcoming appointments, etc.  Non-urgent messages can be sent to your  provider as well.   To learn more about what you can do with MyChart, go to [email or web address].    Your next appointment:   1 week Nurse Visit for BP Check 3 month(s) with APP  The format for your next appointment:   In Person  Provider:   Jari Favre, PA-C, Ronie Spies, PA-C, Robin Searing, NP, Tereso Newcomer, PA-C, or Perlie Gold, PA-C

## 2023-02-25 ENCOUNTER — Ambulatory Visit: Payer: Medicaid Other

## 2023-02-25 ENCOUNTER — Ambulatory Visit: Payer: Medicaid Other | Attending: Cardiology

## 2023-02-25 VITALS — BP 110/70 | HR 64 | Wt 115.0 lb

## 2023-02-25 DIAGNOSIS — Z7689 Persons encountering health services in other specified circumstances: Secondary | ICD-10-CM | POA: Diagnosis not present

## 2023-02-25 DIAGNOSIS — I10 Essential (primary) hypertension: Secondary | ICD-10-CM

## 2023-02-25 NOTE — Progress Notes (Signed)
   Nurse Visit   Date of Encounter: 02/25/2023 ID: Regina Daugherty, DOB [birth year], MRN [id number]  PCP:  Donell Beers, FNP   [facility] Providers Cardiologist:  None      Visit Details   VS:  BP 110/70 (BP Location: Right Arm, Patient Position: Sitting, Cuff Size: Small)   Pulse 64   Wt 115 lb (52.2 kg)   BMI 18.56 kg/m  , BMI Body mass index is 18.56 kg/m.  Wt Readings from Last 3 Encounters:  02/25/23 115 lb (52.2 kg)  02/18/23 116 lb (52.6 kg)  12/16/22 120 lb 9.6 oz (54.7 kg)     Reason for visit: BP check Performed today: Vitals, Provider Consulted : Dr. Rosemary Holms, Education Changes (medications, testing, etc.) : None Length of Visit: 10 minutes    Medications Adjustments/Labs and Tests Ordered:  Patient aware there will be no medication changes today. She will continue to monitor BP at home. Keep follow up as scheduled 06/01/23   Signed, Auria Mckinlay Merilynn Finland, LPN  78/06/9560 1:30 PM

## 2023-02-26 ENCOUNTER — Telehealth: Payer: Self-pay | Admitting: *Deleted

## 2023-02-26 DIAGNOSIS — E871 Hypo-osmolality and hyponatremia: Secondary | ICD-10-CM

## 2023-02-26 LAB — BASIC METABOLIC PANEL
BUN/Creatinine Ratio: 23 (ref 9–23)
BUN: 24 mg/dL (ref 6–24)
CO2: 22 mmol/L (ref 20–29)
Calcium: 10.7 mg/dL — ABNORMAL HIGH (ref 8.7–10.2)
Chloride: 72 mmol/L — CL (ref 96–106)
Creatinine, Ser: 1.05 mg/dL — ABNORMAL HIGH (ref 0.57–1.00)
Glucose: 120 mg/dL — ABNORMAL HIGH (ref 70–99)
Potassium: 4.2 mmol/L (ref 3.5–5.2)
Sodium: 120 mmol/L — ABNORMAL LOW (ref 134–144)
eGFR: 61 mL/min/{1.73_m2} (ref >59)

## 2023-02-26 MED ORDER — AMLODIPINE BESYLATE 5 MG PO TABS: 5.0000 mg | ORAL_TABLET | Freq: Every day | ORAL | 3 refills | Status: DC

## 2023-02-26 NOTE — Telephone Encounter (Signed)
-----   Message from Orbie Pyo sent at 02/26/2023  6:59 AM EDT ----- Regina Daugherty stop chlorthalidone and start amlodipine 5 mg.

## 2023-02-26 NOTE — Telephone Encounter (Signed)
Spoke w patient and adv her to stop chlorthalidone and start amlodipine 5 mg daily.  Did not have dose today.  She does not have a cuff at home or money today to purchase one.  She will monitor for lightheadedness/fatigue.  Adv to eat some salty snacks for today.  Adv I would forward lab results to PCP which I have done.  Pt returning for echo on 03/12/23.

## 2023-02-26 NOTE — Telephone Encounter (Signed)
   You  Orbie Pyo, MD1 hour ago (1:57 PM)    Should we recheck labs when she returns for echo on 10/24?     Orbie Pyo, MD  You1 hour ago (2:23 PM)   AT sure  ____________________________________________________________  Order placed for bmet for 03/12/23.  Pt aware.

## 2023-02-27 ENCOUNTER — Other Ambulatory Visit: Payer: Self-pay | Admitting: Nurse Practitioner

## 2023-03-06 ENCOUNTER — Telehealth: Payer: Self-pay | Admitting: *Deleted

## 2023-03-06 NOTE — Telephone Encounter (Addendum)
LAB ORDERS PLACED FROM DR. Lynnette Caffey - W CC'D RESULTS TO Andres Shad, FNP   ----- Message from Orbie Pyo sent at 03/04/2023  4:50 PM EDT ----- Regarding: RE: Hypercalcemia [name], can you help with this or perhaps order these labs so the results get to Ross Ludwig? ----- Message ----- From: Donell Beers, FNP Sent: 03/04/2023   4:16 PM EDT To: Renelda Loma, RMA; Orbie Pyo, MD Subject: [name]: Hypercalcemia                              Good Day Dr Lynnette Caffey.  I ordered a PTH, intact and calcium lab for this patient  to further evaluate her  hypercalcemia but she is  unable to come to our office to get her labs done due to lack of transportation.  Could you please obtain the labs at her next appointment with you .  Thank you ----- Message ----- From: Renelda Loma, RMA Sent: 03/04/2023   3:45 PM EDT To: Donell Beers, FNP Subject: FW: Hyperkalemia                               Please advise [name] ----- Message ----- From: Donell Beers, FNP Sent: 03/02/2023   4:33 PM EDT To: Renelda Loma, RMA Subject: RE: Hyperkalemia                               Please call him and schedule an appointment for labs. Thanks ----- Message ----- From: Renelda Loma, RMA Sent: 03/02/2023   4:16 PM EDT To: Donell Beers, FNP Subject: FW: Hyperkalemia                               Labs done on the 9th . KH ----- Message ----- From: Donell Beers, FNP Sent: 02/27/2023   8:41 AM EDT To: Renelda Loma, RMA Subject: Hyperkalemia                                   I ordered labs to further evaluate this patient's hyperkalemia.  Please call the patient and schedule an appointment as soon as possible for the labs.  Thank you

## 2023-03-12 ENCOUNTER — Ambulatory Visit: Payer: Medicaid Other

## 2023-03-12 ENCOUNTER — Ambulatory Visit (HOSPITAL_COMMUNITY): Payer: Medicaid Other | Attending: Internal Medicine

## 2023-03-12 DIAGNOSIS — I10 Essential (primary) hypertension: Secondary | ICD-10-CM

## 2023-03-12 DIAGNOSIS — E871 Hypo-osmolality and hyponatremia: Secondary | ICD-10-CM

## 2023-03-12 DIAGNOSIS — Z7689 Persons encountering health services in other specified circumstances: Secondary | ICD-10-CM | POA: Diagnosis not present

## 2023-03-12 LAB — BASIC METABOLIC PANEL
BUN/Creatinine Ratio: 10 (ref 9–23)
BUN: 6 mg/dL (ref 6–24)
CO2: 33 mmol/L — ABNORMAL HIGH (ref 20–29)
Calcium: 9.9 mg/dL (ref 8.7–10.2)
Chloride: 90 mmol/L — ABNORMAL LOW (ref 96–106)
Creatinine, Ser: 0.61 mg/dL (ref 0.57–1.00)
Glucose: 100 mg/dL — ABNORMAL HIGH (ref 70–99)
Potassium: 3.6 mmol/L (ref 3.5–5.2)
Sodium: 137 mmol/L (ref 134–144)
eGFR: 103 mL/min/{1.73_m2} (ref >59)

## 2023-03-12 LAB — ECHOCARDIOGRAM COMPLETE
Area-P 1/2: 4.31 cm2
MV M vel: 4.58 m/s
MV Peak grad: 83.9 mm[Hg]
P 1/2 time: 536 ms
S' Lateral: 2.1 cm

## 2023-03-13 ENCOUNTER — Other Ambulatory Visit: Payer: Self-pay

## 2023-03-13 ENCOUNTER — Telehealth: Payer: Self-pay | Admitting: Internal Medicine

## 2023-03-13 DIAGNOSIS — Z79899 Other long term (current) drug therapy: Secondary | ICD-10-CM

## 2023-03-13 DIAGNOSIS — I10 Essential (primary) hypertension: Secondary | ICD-10-CM

## 2023-03-13 DIAGNOSIS — I517 Cardiomegaly: Secondary | ICD-10-CM

## 2023-03-13 LAB — PTH, INTACT AND CALCIUM
Calcium: 9.8 mg/dL (ref 8.7–10.2)
PTH: 33 pg/mL (ref 15–65)

## 2023-03-13 MED ORDER — LOSARTAN POTASSIUM 25 MG PO TABS: 25.0000 mg | ORAL_TABLET | Freq: Every day | ORAL | 6 refills | Status: DC

## 2023-03-13 NOTE — Telephone Encounter (Signed)
-----   Message from Orbie Pyo sent at 03/12/2023  6:21 PM EDT ----- D/C atenolol, start losartan 25mg  due to LVH (and continue chlorthalidone), BMP in one week.  Cardiac MR to evaluate LVH.  Thanks.

## 2023-03-13 NOTE — Telephone Encounter (Signed)
Spoke with patient and discussed lab result and echo results.  Per Dr. Lynnette Caffey: Labs stable.  (Echo results): D/C atenolol, start losartan 25mg  due to LVH (and continue chlorthalidone), BMP in one week.  Cardiac MR to evaluate LVH.  Thanks.   Patient verbalized frustration over more changes to her medications and having to have more labs/tests performed.   Patient would like to start taking losartan 1 week prior to her PCP appt scheduled for 04/14/23 and will have follow-up BMP at that visit. She states it is too much for her to go back and forth to different offices, it is costly and she has to arrange transportation for each trip to/from her home.  Cardiac MRI ordered, a scheduler will call patient to schedule appt for test.  Atenolol discontinued, she continue to take what she has until she picks up the losartan and will stop taking the atenolol.  Lab and echo results faxed to PCP Edwin Dada, FNP), per patient's request.  Patient verbalized understanding of the above recommendations.

## 2023-03-20 DIAGNOSIS — Z419 Encounter for procedure for purposes other than remedying health state, unspecified: Secondary | ICD-10-CM | POA: Diagnosis not present

## 2023-04-14 ENCOUNTER — Other Ambulatory Visit (INDEPENDENT_AMBULATORY_CARE_PROVIDER_SITE_OTHER): Payer: Medicaid Other | Admitting: Pharmacist

## 2023-04-14 ENCOUNTER — Encounter: Payer: Self-pay | Admitting: Nurse Practitioner

## 2023-04-14 ENCOUNTER — Ambulatory Visit (INDEPENDENT_AMBULATORY_CARE_PROVIDER_SITE_OTHER): Payer: Medicaid Other | Admitting: Nurse Practitioner

## 2023-04-14 VITALS — BP 123/80 | HR 91 | Wt 120.8 lb

## 2023-04-14 DIAGNOSIS — L918 Other hypertrophic disorders of the skin: Secondary | ICD-10-CM | POA: Diagnosis not present

## 2023-04-14 DIAGNOSIS — Z23 Encounter for immunization: Secondary | ICD-10-CM | POA: Diagnosis not present

## 2023-04-14 DIAGNOSIS — G8929 Other chronic pain: Secondary | ICD-10-CM | POA: Diagnosis not present

## 2023-04-14 DIAGNOSIS — I10 Essential (primary) hypertension: Secondary | ICD-10-CM

## 2023-04-14 DIAGNOSIS — Z720 Tobacco use: Secondary | ICD-10-CM

## 2023-04-14 DIAGNOSIS — M25561 Pain in right knee: Secondary | ICD-10-CM

## 2023-04-14 DIAGNOSIS — Z7689 Persons encountering health services in other specified circumstances: Secondary | ICD-10-CM | POA: Diagnosis not present

## 2023-04-14 MED ORDER — DICLOFENAC SODIUM 1 % EX GEL: 4.0000 g | Freq: Four times a day (QID) | CUTANEOUS | 1 refills | Status: DC

## 2023-04-14 NOTE — Patient Instructions (Addendum)
Please consider getting Shingrix and Tdap vaccine at local pharmacy.  You were given your flu vaccine in the office  I have referred you to [facility] dermatology for your skin tag removal.   [phone].  Please call the office in 3 to 4 weeks if you do not hear from them   It is important that you exercise regularly at least 30 minutes 5 times a week as tolerated  Think about what you will eat, plan ahead. Choose " clean, green, fresh or frozen" over canned, processed or packaged foods which are more sugary, salty and fatty. 70 to 75% of food eaten should be vegetables and fruit. Three meals at set times with snacks allowed between meals, but they must be fruit or vegetables. Aim to eat over a 12 hour period , example 7 am to 7 pm, and STOP after  your last meal of the day. Drink water,generally about 64 ounces per day, no other drink is as healthy. Fruit juice is best enjoyed in a healthy way, by EATING the fruit.  Thanks for choosing [facility] we consider it a privelige to serve you.

## 2023-04-14 NOTE — Assessment & Plan Note (Signed)
Patient referred to dermatology

## 2023-04-14 NOTE — Progress Notes (Signed)
Established Patient Office Visit  Subjective:  Patient ID: Ivona Cooksey, female    DOB: August 28, 1965  Age: 57 y.o. MRN: [id number]  CC:  Chief Complaint  Patient presents with   Hypertension   Skin Tag    HPI Larrie Haffner is a 57 y.o. female  has a past medical history of Chronic pain of right knee (11/18/2022), DJD (degenerative joint disease) (06/28/2013), Essential hypertension, benign (06/28/2013), Hypertension, IFG (impaired fasting glucose) (06/28/2013), Palpitations (11/18/2022), Smoking (06/28/2013), and Tobacco abuse (11/18/2022).  Patient presents for follow-up for her chronic medical conditions  Hypertension.  Currently on amlodipine 5 mg daily, losartan 25 mg daily.  She denies chest pain shortness of breath edema.  She needs a blood pressure cuff for blood pressure monitoring at home  Skin tag patient complains of a skin tag on her left thigh area, she will like the  skin tag removed  Has upcoming dental appointment for tooth extraction on December 2, also have an upcoming cardiac MRI on December 30.  Patient encouraged to get Tdap vaccine and shingles vaccine at the pharmacy, flu vaccine given in the office today.    Past Medical History:  Diagnosis Date   Chronic pain of right knee 11/18/2022   DJD (degenerative joint disease) 06/28/2013   Essential hypertension, benign 06/28/2013   Hypertension    IFG (impaired fasting glucose) 06/28/2013   Palpitations 11/18/2022   Smoking 06/28/2013   Tobacco abuse 11/18/2022    No past surgical history on file.  Family History  Problem Relation Age of Onset   Cancer Mother    Diabetes Mother    Cancer Father    Diabetes Father    Cancer Sister    Heart disease Sister    Hypertension Sister    Diabetes Sister     Social History   Socioeconomic History   Marital status: Single    Spouse name: Not on file   Number of children: 3   Years of education: Not on file   Highest education level: Not on file   Occupational History   Not on file  Tobacco Use   Smoking status: Every Day    Current packs/day: 0.50    Average packs/day: 0.5 packs/day for 15.0 years (7.5 ttl pk-yrs)    Types: Cigarettes   Smokeless tobacco: Not on file  Substance and Sexual Activity   Alcohol use: Yes    Alcohol/week: 2.0 standard drinks of alcohol    Types: 2 Standard drinks or equivalent per week    Comment: couple of beers    Drug use: No   Sexual activity: Not Currently  Other Topics Concern   Not on file  Social History Narrative   Lives home alone    Social Determinants of Health   Financial Resource Strain: Not on file  Food Insecurity: Not on file  Transportation Needs: Not on file  Physical Activity: Not on file  Stress: Not on file  Social Connections: Not on file  Intimate Partner Violence: Not on file    Outpatient Medications Prior to Visit  Medication Sig Dispense Refill   amLODipine (NORVASC) 5 MG tablet Take 1 tablet (5 mg total) by mouth daily. 90 tablet 3   cyclobenzaprine (FLEXERIL) 5 MG tablet Take 1 tablet (5 mg total) by mouth at bedtime as needed for muscle spasms. 30 tablet 0   losartan (COZAAR) 25 MG tablet Take 1 tablet (25 mg total) by mouth daily. 30 tablet 6   nicotine (NICODERM CQ) 21  mg/24hr patch Place 1 patch (21 mg total) onto the skin daily. 28 patch 0   diclofenac Sodium (VOLTAREN) 1 % GEL Apply 4 g topically 4 (four) times daily. 50 g 1   carbamide peroxide (DEBROX) 6.5 % OTIC solution Place 5 drops into the right ear 2 (two) times daily. (Patient not taking: Reported on 04/14/2023) 15 mL 0   No facility-administered medications prior to visit.    No Known Allergies  ROS Review of Systems  Constitutional:  Negative for appetite change, chills, fatigue and fever.  HENT:  Negative for congestion, postnasal drip, rhinorrhea and sneezing.   Respiratory:  Negative for cough, shortness of breath and wheezing.   Cardiovascular:  Negative for chest pain,  palpitations and leg swelling.  Gastrointestinal:  Negative for abdominal pain, constipation, nausea and vomiting.  Genitourinary:  Negative for difficulty urinating, dysuria, flank pain and frequency.  Musculoskeletal:  Negative for arthralgias, back pain, joint swelling and myalgias.  Skin:  Negative for color change, pallor, rash and wound.  Neurological:  Negative for dizziness, facial asymmetry, weakness, numbness and headaches.  Psychiatric/Behavioral:  Negative for behavioral problems, confusion, self-injury and suicidal ideas.       Objective:    Physical Exam Vitals and nursing note reviewed.  Constitutional:      General: She is not in acute distress.    Appearance: Normal appearance. She is not ill-appearing, toxic-appearing or diaphoretic.  HENT:     Mouth/Throat:     Mouth: Mucous membranes are moist.     Pharynx: Oropharynx is clear. No oropharyngeal exudate or posterior oropharyngeal erythema.  Eyes:     General: No scleral icterus.       Right eye: No discharge.        Left eye: No discharge.     Extraocular Movements: Extraocular movements intact.     Conjunctiva/sclera: Conjunctivae normal.  Cardiovascular:     Rate and Rhythm: Normal rate and regular rhythm.     Pulses: Normal pulses.     Heart sounds: Normal heart sounds. No murmur heard.    No friction rub. No gallop.  Pulmonary:     Effort: Pulmonary effort is normal. No respiratory distress.     Breath sounds: Normal breath sounds. No stridor. No wheezing, rhonchi or rales.  Chest:     Chest wall: No tenderness.  Abdominal:     General: There is no distension.     Palpations: Abdomen is soft.     Tenderness: There is no abdominal tenderness. There is no right CVA tenderness, left CVA tenderness or guarding.  Musculoskeletal:        General: No swelling, tenderness, deformity or signs of injury.     Right lower leg: No edema.     Left lower leg: No edema.  Skin:    General: Skin is warm and dry.      Capillary Refill: Capillary refill takes less than 2 seconds.     Coloration: Skin is not jaundiced or pale.     Findings: No bruising, erythema or lesion.     Comments: One Skin tag noted on left thigh.  No drainage or redness noted  Neurological:     Mental Status: She is alert and oriented to person, place, and time.     Motor: No weakness.     Coordination: Coordination normal.     Gait: Gait normal.  Psychiatric:        Mood and Affect: Mood normal.  Behavior: Behavior normal.        Thought Content: Thought content normal.        Judgment: Judgment normal.     BP 123/80   Pulse 91   Wt 120 lb 12.8 oz (54.8 kg)   SpO2 100%   BMI 19.50 kg/m  Wt Readings from Last 3 Encounters:  04/14/23 120 lb 12.8 oz (54.8 kg)  02/25/23 115 lb (52.2 kg)  02/18/23 116 lb (52.6 kg)    Lab Results  Component Value Date   TSH 1.400 11/18/2022   Lab Results  Component Value Date   WBC 7.5 11/18/2022   HGB 14.5 11/18/2022   HCT 47.8 (H) 11/18/2022   MCV 72 (L) 11/18/2022   PLT 283 11/18/2022   Lab Results  Component Value Date   NA 137 03/12/2023   K 3.6 03/12/2023   CO2 33 (H) 03/12/2023   GLUCOSE 100 (H) 03/12/2023   BUN 6 03/12/2023   CREATININE 0.61 03/12/2023   BILITOT 0.5 11/18/2022   ALKPHOS 85 11/18/2022   AST 26 11/18/2022   ALT 15 11/18/2022   PROT 7.8 11/18/2022   ALBUMIN 4.8 11/18/2022   CALCIUM 9.9 03/12/2023   EGFR 103 03/12/2023   Lab Results  Component Value Date   CHOL 166 12/23/2022   Lab Results  Component Value Date   HDL 58 12/23/2022   Lab Results  Component Value Date   LDLCALC 90 12/23/2022   Lab Results  Component Value Date   TRIG 97 12/23/2022   Lab Results  Component Value Date   CHOLHDL 2.9 12/23/2022   Lab Results  Component Value Date   HGBA1C 5.8 (H) 12/23/2022      Assessment & Plan:   Problem List Items Addressed This Visit       Cardiovascular and Mediastinum   Essential hypertension, benign - Primary     BP Readings from Last 3 Encounters:  04/14/23 123/80  02/25/23 110/70  02/18/23 (!) 160/80  HTN Controlled .  On losartan 25 mg daily, amlodipine 5 mg daily Continue current medications. No changes in management. Discussed DASH diet and dietary sodium restrictions Continue to increase dietary efforts and exercise.  BMP today Referral sent to the clinical pharmacist, needs a blood pressure machine Appreciate collaboration with cardiology         Relevant Orders   Basic metabolic panel   AMB Referral VBCI Care Management     Musculoskeletal and Integument   Skin tag    Patient referred to dermatology      Relevant Orders   Ambulatory referral to Dermatology     Other   Tobacco abuse    Smokes Cigars on some days  Smoking cessation encouraged.       Chronic pain of right knee   Relevant Medications   diclofenac Sodium (VOLTAREN) 1 % GEL   Need for influenza vaccination    Patient educated on CDC recommendation for the vaccine. Verbal consent was obtained from the patient, vaccine administered by nurse, no sign of adverse reactions noted at this time. Patient education on arm soreness and use of tylenol  for this patient  was discussed. Patient educated on the signs and symptoms of adverse effect and advise to contact the office if they occur. Vaccine information sheet given to patient.        Relevant Orders   Flu vaccine trivalent PF, 6mos and older(Flulaval,Afluria,Fluarix,Fluzone) (Completed)    Meds ordered this encounter  Medications   diclofenac Sodium (  VOLTAREN) 1 % GEL    Sig: Apply 4 g topically 4 (four) times daily.    Dispense:  50 g    Refill:  1    Follow-up: Return in about 6 months (around 10/12/2023) for HTN.    Donell Beers, FNP

## 2023-04-14 NOTE — Assessment & Plan Note (Signed)
Patient educated on CDC recommendation for the vaccine. Verbal consent was obtained from the patient, vaccine administered by nurse, no sign of adverse reactions noted at this time. Patient education on arm soreness and use of tylenol  for this patient  was discussed. Patient educated on the signs and symptoms of adverse effect and advise to contact the office if they occur. Vaccine information sheet given to patient.  

## 2023-04-14 NOTE — Assessment & Plan Note (Addendum)
Smokes Cigars on some days  Smoking cessation encouraged.

## 2023-04-14 NOTE — Assessment & Plan Note (Addendum)
BP Readings from Last 3 Encounters:  04/14/23 123/80  02/25/23 110/70  02/18/23 (!) 160/80  HTN Controlled .  On losartan 25 mg daily, amlodipine 5 mg daily Continue current medications. No changes in management. Discussed DASH diet and dietary sodium restrictions Continue to increase dietary efforts and exercise.  BMP today Referral sent to the clinical pharmacist, needs a blood pressure machine Appreciate collaboration with cardiology

## 2023-04-14 NOTE — Progress Notes (Signed)
Care Coordination Call  Order requested for BP Cuff that is covered by [facility] Medicaid. Order sent to [facility].   Will notify patient.   Catie Eppie Gibson, PharmD, BCACP, CPP Clinical Pharmacist [facility] [phone]

## 2023-04-15 LAB — BASIC METABOLIC PANEL
BUN/Creatinine Ratio: 14 (ref 9–23)
BUN: 10 mg/dL (ref 6–24)
CO2: 24 mmol/L (ref 20–29)
Calcium: 9.6 mg/dL (ref 8.7–10.2)
Chloride: 97 mmol/L (ref 96–106)
Creatinine, Ser: 0.73 mg/dL (ref 0.57–1.00)
Glucose: 110 mg/dL — ABNORMAL HIGH (ref 70–99)
Potassium: 3.8 mmol/L (ref 3.5–5.2)
Sodium: 138 mmol/L (ref 134–144)
eGFR: 95 mL/min/{1.73_m2} (ref >59)

## 2023-04-19 DIAGNOSIS — Z419 Encounter for procedure for purposes other than remedying health state, unspecified: Secondary | ICD-10-CM | POA: Diagnosis not present

## 2023-04-20 DIAGNOSIS — Z7689 Persons encountering health services in other specified circumstances: Secondary | ICD-10-CM | POA: Diagnosis not present

## 2023-04-22 DIAGNOSIS — I10 Essential (primary) hypertension: Secondary | ICD-10-CM | POA: Diagnosis not present

## 2023-04-29 DIAGNOSIS — Z7689 Persons encountering health services in other specified circumstances: Secondary | ICD-10-CM | POA: Diagnosis not present

## 2023-05-15 ENCOUNTER — Telehealth (HOSPITAL_COMMUNITY): Payer: Self-pay | Admitting: *Deleted

## 2023-05-15 NOTE — Telephone Encounter (Signed)
Reaching out to patient to offer assistance regarding upcoming cardiac imaging study; pt verbalizes understanding of appt date/time, parking situation and where to check in,  and verified current allergies; name and call back number provided for further questions should they arise  Larey Brick RN Navigator Cardiac Imaging [facility] [phone] office [phone] cell  Patient unsure if she is claustrophobic but is requesting to have her son by her during study.  She does deny any metal.

## 2023-05-18 ENCOUNTER — Other Ambulatory Visit: Payer: Self-pay | Admitting: Internal Medicine

## 2023-05-18 ENCOUNTER — Ambulatory Visit (HOSPITAL_COMMUNITY)
Admission: RE | Admit: 2023-05-18 | Discharge: 2023-05-18 | Disposition: A | Discharge status: Home / Self Care | Payer: Medicaid Other | Source: Ambulatory Visit | Attending: Internal Medicine | Admitting: Internal Medicine

## 2023-05-18 DIAGNOSIS — I517 Cardiomegaly: Secondary | ICD-10-CM

## 2023-05-18 MED ORDER — GADOBUTROL 1 MMOL/ML IV SOLN
8.0000 mL | Freq: Once | INTRAVENOUS | Status: AC | PRN
  Administered 2023-05-18: 8 mL via INTRAVENOUS

## 2023-05-20 DIAGNOSIS — Z419 Encounter for procedure for purposes other than remedying health state, unspecified: Secondary | ICD-10-CM | POA: Diagnosis not present

## 2023-05-21 DIAGNOSIS — Z7689 Persons encountering health services in other specified circumstances: Secondary | ICD-10-CM | POA: Diagnosis not present

## 2023-05-22 DIAGNOSIS — Z7689 Persons encountering health services in other specified circumstances: Secondary | ICD-10-CM | POA: Diagnosis not present

## 2023-05-26 ENCOUNTER — Telehealth: Payer: Self-pay | Admitting: Internal Medicine

## 2023-05-26 NOTE — Telephone Encounter (Signed)
 Called patient and advised of appointment to come in to discuss results of cardiac MRI for severe MR.  Pt. Scheduled later this month.  Cancelled her f/u with APP on 06/01/23.  She is grateful for assistance.

## 2023-05-26 NOTE — Telephone Encounter (Signed)
 Pt calling for MRI results. She is scheduled to see Alden Server, NP 06/01/23

## 2023-06-01 ENCOUNTER — Ambulatory Visit: Payer: Medicaid Other | Admitting: Nurse Practitioner

## 2023-06-15 DIAGNOSIS — Z7689 Persons encountering health services in other specified circumstances: Secondary | ICD-10-CM | POA: Diagnosis not present

## 2023-06-16 NOTE — H&P (View-Only) (Signed)
Cardiology Office Note:   Date:  06/17/2023  ID:  Regina Daugherty, DOB 24-Jan-1966, MRN [id number] PCP:  Donell Beers, FNP  [facility] Providers Cardiologist:  Alverda Skeans, MD Referring MD: Donell Beers, FNP  Chief Complaint/Reason for Referral: Cardiology follow-up ASSESSMENT:    1. Mitral valve insufficiency, unspecified etiology   2. Diastolic dysfunction without heart failure   3. Essential hypertension, benign   4. Palpitations   5. Tobacco abuse counseling     PLAN:   In order of problems listed above: Mitral regurgitation: She is tachycardic today and I do not appreciate a loud murmur.  I did not appreciate a loud murmur the last time I saw her.  This is at odds with the CMR which demonstrates severe mitral regurgitation.  Her TTE which I reviewed demonstrates moderate mitral regurgitation.  Because of this diagnostic dilemma though I would trust the CMR more so I will refer the patient for a TEE; given abnormal CMR with possible inferolateral infarction will refer for coronary angiography and right heart catheterization to evaluate further.   Diastolic dysfunction: Continue losartan and start Jardiance 10 mg daily.   Hypertension: BP controlled today. Palpitations: Had resolved on atenolol. Tobacco abuse:  Patient continues to smoke; she knows she needs to quit.        Informed Consent   Shared Decision Making/Informed Consent   The risks [esophageal damage, perforation (1:10,000 risk), bleeding, pharyngeal hematoma as well as other potential complications associated with conscious sedation including aspiration, arrhythmia, respiratory failure and death], benefits (treatment guidance and diagnostic support) and alternatives of a transesophageal echocardiogram were discussed in detail with Regina Daugherty and she is willing to proceed.       Dispo:  Return in about 3 months (around 09/15/2023).      Medication Adjustments/Labs and Tests Ordered: Current  medicines are reviewed at length with the patient today.  Concerns regarding medicines are outlined above.  The following changes have been made:     Labs/tests ordered: Orders Placed This Encounter  Procedures   CBC   Basic metabolic panel   EKG 12-Lead    Medication Changes: Meds ordered this encounter  Medications   empagliflozin (JARDIANCE) 10 MG TABS tablet    Sig: Take 1 tablet (10 mg total) by mouth daily before breakfast.    Dispense:  30 tablet    Refill:  11   empagliflozin (JARDIANCE) 10 MG TABS tablet    Sig: Take 1 tablet (10 mg total) by mouth daily before breakfast.    Dispense:  28 tablet    Lot Number?:   [id number]    Expiration Date?:   04/17/2025    Current medicines are reviewed at length with the patient today.  The patient does not have concerns regarding medicines.  I spent 39 minutes reviewing all clinical data during and prior to this visit including all relevant imaging studies, laboratories, clinical information from other health systems and prior notes from both Cardiology and other specialties, interviewing the patient, conducting a complete physical examination, and coordinating care in order to formulate a comprehensive and personalized evaluation and treatment plan.   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 female with the indicated medical history here for recommendations regarding palpitations.  The patient was seen by her primary care provider in July.  At that point in time she was complaining of dizziness.  An EKG at that time demonstrated left ventricular  hypertrophy and possible left atrial enlargement.  For this reason she was referred to cardiology.  Labs at that time including a TSH, CMP, and CBC were within normal limits.    The patient tells me that her palpitations have resolved.  She tells me that this was due to the fact she was taking her blood pressure medications.  She tells me she is doing well.  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  She denies any exertional angina, exertional dyspnea, presyncope, palpitations, paroxysmal nocturnal dyspnea, orthopnea.  She does continue to smoke however she is down to about 6 cigarettes a week.  She denies any claudication symptoms.  She is trying to quit and is on a NicoDerm patch.  Plan: Obtain echocardiogram and monitor; start chlorthalidone 25 mg and check BMP in 1 week.  Nurse blood pressure check in 1 week.  1/25: In the interim the patient did not have a monitor completed.  Her echocardiogram demonstrated severe LVH and she was referred for cardiac MRI which showed severe mitral valve regurgitation and a perfusion defect in the inferolateral lateral wall.  She is here to discuss those findings.  She actually feels quite well.  She is nervous today about the findings on of her CMR.  She denies any chest pain or shortness of breath.  She is able to go up and down stairs without any issues.  She is surprised that her CMR demonstrated severe mitral regurgitation and possibly an old myocardial infarction.  She has been compliant with her medical therapy without issues.             Current Medications: Current Meds  Medication Sig   amLODipine (NORVASC) 5 MG tablet Take 1 tablet (5 mg total) by mouth daily.   chlorthalidone (HYGROTON) 25 MG tablet Take 25 mg by mouth daily.   cyclobenzaprine (FLEXERIL) 5 MG tablet Take 1 tablet (5 mg total) by mouth at bedtime as needed for muscle spasms.   diclofenac Sodium (VOLTAREN) 1 % GEL Apply 4 g topically 4 (four) times  daily.   empagliflozin (JARDIANCE) 10 MG TABS tablet Take 1 tablet (10 mg total) by mouth daily before breakfast.   empagliflozin (JARDIANCE) 10 MG TABS tablet Take 1 tablet (10 mg total) by mouth daily before breakfast.   losartan (COZAAR) 25 MG tablet Take 1 tablet (25 mg total) by mouth daily.   nicotine (NICODERM CQ) 21 mg/24hr patch Place 1 patch (21 mg total) onto the skin daily.     Review of Systems:   Please see the history of present illness.    All other systems reviewed and are negative.     EKGs/Labs/Other Test Reviewed:   EKG:   EKG Interpretation Date/Time:  Wednesday June 17 2023 09:02:00 EST Ventricular Rate:  147 PR Interval:  126 QRS Duration:  88 QT Interval:  336 QTC Calculation: 525 R Axis:   90  Text Interpretation: Sinus tachycardia Rightward axis Nonspecific ST abnormality When compared with ECG of 18-Feb-2023 09:17, Vent. rate has increased BY  60 BPM Confirmed by Alverda Skeans ([id number]) on 06/17/2023 9:03:53 AM  Risk Assessment/Calculations:          Physical Exam:   VS:  BP 110/62   Pulse (!) 150   Ht 5\' 6"  (1.676 m)   Wt 122 lb 3.2 oz (55.4 kg)   SpO2 99%   BMI 19.72 kg/m        Wt Readings from Last 3 Encounters:  06/17/23 122 lb 3.2 oz (55.4 kg)  04/14/23 120 lb 12.8 oz (54.8 kg)  02/25/23 115 lb (52.2 kg)      GENERAL:  No apparent distress, AOx3 HEENT:  No carotid bruits, +2 carotid impulses, no scleral icterus CAR: Tachycardic no murmurs, gallops, rubs, or thrills RES:  Clear to auscultation bilaterally ABD:  Soft, nontender, nondistended, positive bowel sounds x 4 VASC:  +2 radial pulses, +2 carotid pulses NEURO:  CN 2-12 grossly intact; motor and sensory grossly intact PSYCH:  No active depression or anxiety EXT:  No edema, ecchymosis, or cyanosis  Signed, Orbie Pyo, MD  06/17/2023 9:56 AM    [facility] [address] [place], [place], [place]  [address] Phone: [phone]; Fax: [phone]   Note:  This document was prepared using Dragon voice recognition software and may include unintentional dictation errors.

## 2023-06-16 NOTE — Progress Notes (Unsigned)
Cardiology Office Note:   Date:  06/17/2023  ID:  Anwar Crill, DOB [birth year], MRN [id number] PCP:  Donell Beers, FNP  [facility] Providers Cardiologist:  Alverda Skeans, MD Referring MD: Donell Beers, FNP  Chief Complaint/Reason for Referral: Cardiology follow-up ASSESSMENT:    1. Mitral valve insufficiency, unspecified etiology   2. Diastolic dysfunction without heart failure   3. Essential hypertension, benign   4. Palpitations   5. Tobacco abuse counseling     PLAN:   In order of problems listed above: Mitral regurgitation: She is tachycardic today and I do not appreciate a loud murmur.  I did not appreciate a loud murmur the last time I saw her.  This is at odds with the CMR which demonstrates severe mitral regurgitation.  Her TTE which I reviewed demonstrates moderate mitral regurgitation.  Because of this diagnostic dilemma though I would trust the CMR more so I will refer the patient for a TEE; given abnormal CMR with possible inferolateral infarction will refer for coronary angiography and right heart catheterization to evaluate further.   Diastolic dysfunction: Continue losartan and start Jardiance 10 mg daily.   Hypertension: BP controlled today. Palpitations: Had resolved on atenolol. Tobacco abuse:  Patient continues to smoke; she knows she needs to quit.        Informed Consent   Shared Decision Making/Informed Consent   The risks [esophageal damage, perforation (1:10,000 risk), bleeding, pharyngeal hematoma as well as other potential complications associated with conscious sedation including aspiration, arrhythmia, respiratory failure and death], benefits (treatment guidance and diagnostic support) and alternatives of a transesophageal echocardiogram were discussed in detail with Ms. Galvan and she is willing to proceed.       Dispo:  Return in about 3 months (around 09/15/2023).      Medication Adjustments/Labs and Tests Ordered: Current  medicines are reviewed at length with the patient today.  Concerns regarding medicines are outlined above.  The following changes have been made:     Labs/tests ordered: Orders Placed This Encounter  Procedures   CBC   Basic metabolic panel   EKG 12-Lead    Medication Changes: Meds ordered this encounter  Medications   empagliflozin (JARDIANCE) 10 MG TABS tablet    Sig: Take 1 tablet (10 mg total) by mouth daily before breakfast.    Dispense:  30 tablet    Refill:  11   empagliflozin (JARDIANCE) 10 MG TABS tablet    Sig: Take 1 tablet (10 mg total) by mouth daily before breakfast.    Dispense:  28 tablet    Lot Number?:   [id number]    Expiration Date?:   04/17/2025    Current medicines are reviewed at length with the patient today.  The patient does not have concerns regarding medicines.  I spent 39 minutes reviewing all clinical data during and prior to this visit including all relevant imaging studies, laboratories, clinical information from other health systems and prior notes from both Cardiology and other specialties, interviewing the patient, conducting a complete physical examination, and coordinating care in order to formulate a comprehensive and personalized evaluation and treatment plan.   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 female with the indicated medical history here for recommendations regarding palpitations.  The patient was seen by her primary care provider in July.  At that point in time she was complaining of dizziness.  An EKG at that time demonstrated left ventricular  hypertrophy and possible left atrial enlargement.  For this reason she was referred to cardiology.  Labs at that time including a TSH, CMP, and CBC were within normal limits.    The patient tells me that her palpitations have resolved.  She tells me that this was due to the fact she was taking her blood pressure medications.  She tells me she is doing well.  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  She denies any exertional angina, exertional dyspnea, presyncope, palpitations, paroxysmal nocturnal dyspnea, orthopnea.  She does continue to smoke however she is down to about 6 cigarettes a week.  She denies any claudication symptoms.  She is trying to quit and is on a NicoDerm patch.  Plan: Obtain echocardiogram and monitor; start chlorthalidone 25 mg and check BMP in 1 week.  Nurse blood pressure check in 1 week.  1/25: In the interim the patient did not have a monitor completed.  Her echocardiogram demonstrated severe LVH and she was referred for cardiac MRI which showed severe mitral valve regurgitation and a perfusion defect in the inferolateral lateral wall.  She is here to discuss those findings.  She actually feels quite well.  She is nervous today about the findings on of her CMR.  She denies any chest pain or shortness of breath.  She is able to go up and down stairs without any issues.  She is surprised that her CMR demonstrated severe mitral regurgitation and possibly an old myocardial infarction.  She has been compliant with her medical therapy without issues.             Current Medications: Current Meds  Medication Sig   amLODipine (NORVASC) 5 MG tablet Take 1 tablet (5 mg total) by mouth daily.   chlorthalidone (HYGROTON) 25 MG tablet Take 25 mg by mouth daily.   cyclobenzaprine (FLEXERIL) 5 MG tablet Take 1 tablet (5 mg total) by mouth at bedtime as needed for muscle spasms.   diclofenac Sodium (VOLTAREN) 1 % GEL Apply 4 g topically 4 (four) times  daily.   empagliflozin (JARDIANCE) 10 MG TABS tablet Take 1 tablet (10 mg total) by mouth daily before breakfast.   empagliflozin (JARDIANCE) 10 MG TABS tablet Take 1 tablet (10 mg total) by mouth daily before breakfast.   losartan (COZAAR) 25 MG tablet Take 1 tablet (25 mg total) by mouth daily.   nicotine (NICODERM CQ) 21 mg/24hr patch Place 1 patch (21 mg total) onto the skin daily.     Review of Systems:   Please see the history of present illness.    All other systems reviewed and are negative.     EKGs/Labs/Other Test Reviewed:   EKG:   EKG Interpretation Date/Time:  Wednesday June 17 2023 09:02:00 EST Ventricular Rate:  147 PR Interval:  126 QRS Duration:  88 QT Interval:  336 QTC Calculation: 525 R Axis:   90  Text Interpretation: Sinus tachycardia Rightward axis Nonspecific ST abnormality When compared with ECG of 18-Feb-2023 09:17, Vent. rate has increased BY  60 BPM Confirmed by Alverda Skeans ([id number]) on 06/17/2023 9:03:53 AM  Risk Assessment/Calculations:          Physical Exam:   VS:  BP 110/62   Pulse (!) 150   Ht 5\' 6"  (1.676 m)   Wt 122 lb 3.2 oz (55.4 kg)   SpO2 99%   BMI 19.72 kg/m        Wt Readings from Last 3 Encounters:  06/17/23 122 lb 3.2 oz (55.4 kg)  04/14/23 120 lb 12.8 oz (54.8 kg)  02/25/23 115 lb (52.2 kg)      GENERAL:  No apparent distress, AOx3 HEENT:  No carotid bruits, +2 carotid impulses, no scleral icterus CAR: Tachycardic no murmurs, gallops, rubs, or thrills RES:  Clear to auscultation bilaterally ABD:  Soft, nontender, nondistended, positive bowel sounds x 4 VASC:  +2 radial pulses, +2 carotid pulses NEURO:  CN 2-12 grossly intact; motor and sensory grossly intact PSYCH:  No active depression or anxiety EXT:  No edema, ecchymosis, or cyanosis  Signed, Orbie Pyo, MD  06/17/2023 9:56 AM    [facility] [address] [place], [place], [place]  [address] Phone: [phone]; Fax: [phone]   Note:  This document was prepared using Dragon voice recognition software and may include unintentional dictation errors.

## 2023-06-17 ENCOUNTER — Encounter: Payer: Self-pay | Admitting: Internal Medicine

## 2023-06-17 ENCOUNTER — Ambulatory Visit: Payer: Medicaid Other | Attending: Internal Medicine | Admitting: Internal Medicine

## 2023-06-17 VITALS — BP 110/62 | HR 150 | Ht 66.0 in | Wt 122.2 lb

## 2023-06-17 DIAGNOSIS — I5189 Other ill-defined heart diseases: Secondary | ICD-10-CM | POA: Diagnosis not present

## 2023-06-17 DIAGNOSIS — Z716 Tobacco abuse counseling: Secondary | ICD-10-CM

## 2023-06-17 DIAGNOSIS — I10 Essential (primary) hypertension: Secondary | ICD-10-CM | POA: Diagnosis not present

## 2023-06-17 DIAGNOSIS — R002 Palpitations: Secondary | ICD-10-CM | POA: Diagnosis not present

## 2023-06-17 DIAGNOSIS — Z7689 Persons encountering health services in other specified circumstances: Secondary | ICD-10-CM | POA: Diagnosis not present

## 2023-06-17 DIAGNOSIS — I340 Nonrheumatic mitral (valve) insufficiency: Secondary | ICD-10-CM | POA: Diagnosis not present

## 2023-06-17 MED ORDER — EMPAGLIFLOZIN 10 MG PO TABS: 10.0000 mg | ORAL_TABLET | Freq: Every day | ORAL | 11 refills | Status: DC

## 2023-06-17 MED ORDER — EMPAGLIFLOZIN 10 MG PO TABS: 10.0000 mg | ORAL_TABLET | Freq: Every day | ORAL | Status: DC

## 2023-06-17 NOTE — Patient Instructions (Addendum)
Medication Instructions:  Your physician has recommended you make the following change in your medication:   1) START empagliflozin (Jardiance) 10 mg daily  *If you need a refill on your cardiac medications before your next appointment, please call your pharmacy*  Lab Work: TODAY: BMET and CBC  Testing/Procedures: TEE Your physician has requested that you have a TEE. During a TEE, sound waves are used to create images of your heart. It provides your doctor with information about the size and shape of your heart and how well your heart's chambers and valves are working. In this test, a transducer is attached to the end of a flexible tube that's guided down your throat and into your esophagus (the tube leading from you mouth to your stomach) to get a more detailed image of your heart. You are not awake for the procedure. Please see the instruction sheet given to you today. For further information please visit [email or web address]  Heart Catheterization Your physician has requested that you have a right and left cardiac catheterization. Cardiac catheterization is used to diagnose and/or treat various heart conditions. Doctors may recommend this procedure for a number of different reasons. The most common reason is to evaluate chest pain. Chest pain can be a symptom of coronary artery disease (CAD), and cardiac catheterization can show whether plaque is narrowing or blocking your heart's arteries. This procedure is also used to evaluate the valves, as well as measure the blood flow and oxygen levels in different parts of your heart. For further information please visit [email or web address] Please follow instruction sheet, as given.   Follow-Up: At [facility], you and your health needs are our priority.  As part of our continuing mission to provide you with exceptional heart care, we have created designated Provider Care Teams.  These Care Teams include your primary Cardiologist (physician) and  Advanced Practice Providers (APPs -  Physician Assistants and Nurse Practitioners) who all work together to provide you with the care you need, when you need it.    Your next appointment:   3 month(s)  The format for your next appointment:   In Person  Provider:   Orbie Pyo, MD {  Other Instructions       1st Floor: - Lobby - Registration  - Pharmacy  - Lab - Cafe  2nd Floor: - PV Lab - Diagnostic Testing (echo, CT, nuclear med)  3rd Floor: - Vacant  4th Floor: - TCTS (cardiothoracic surgery) - AFib Clinic - Structural Heart Clinic - Vascular Surgery  - Vascular Ultrasound  5th Floor: - HeartCare Cardiology (general and EP) - Clinical Pharmacy for coumadin, hypertension, lipid, weight-loss medications, and med management appointments    Valet parking services will be available as well.

## 2023-06-19 ENCOUNTER — Telehealth: Payer: Self-pay | Admitting: Pharmacy Technician

## 2023-06-19 ENCOUNTER — Other Ambulatory Visit (HOSPITAL_COMMUNITY): Payer: Self-pay

## 2023-06-19 NOTE — Telephone Encounter (Signed)
Pharmacy Patient Advocate Encounter  Received notification from [facility] that Prior Authorization for [place] has been APPROVED from 06/05/23 to 06/18/24. Spoke to pharmacy to process.Copay is $4.00.    PA #/Case ID/Reference #: [id number]

## 2023-06-19 NOTE — Telephone Encounter (Signed)
Pharmacy Patient Advocate Encounter   Received notification from Fax that prior authorization for Jardiance 10MG  tablets is required/requested.   Insurance verification completed.   The patient is insured through [facility] [place] .   Per test claim: PA required; PA submitted to above mentioned insurance via CoverMyMeds Key/confirmation #/EOC [id number] Status is pending

## 2023-06-20 DIAGNOSIS — Z419 Encounter for procedure for purposes other than remedying health state, unspecified: Secondary | ICD-10-CM | POA: Diagnosis not present

## 2023-06-23 ENCOUNTER — Telehealth: Payer: Self-pay | Admitting: *Deleted

## 2023-06-23 NOTE — Telephone Encounter (Signed)
 Cardiac Catheterization scheduled at [facility] for: Tuesday June 30, 2023 1:30 PM/TEE 11 AM Arrival time [facility] Main Entrance A at: 9-9:30 AM-needs BMP/CBC  Nothing to eat or drink after midnight prior to procedures.  Medication instructions: -Hold:  Jardiance -last dose 06/26/23  until post procedure  Chlorthalidone -AM of procedure -Other usual morning medications can be taken with sips of water including aspirin  81 mg.  Plan to go home the same day, you will only stay overnight if medically necessary.  You must have responsible adult to drive you home.  Someone must be with you the first 24 hours after you arrive home.  Reviewed procedure instructions with patient.

## 2023-06-23 NOTE — Telephone Encounter (Signed)
 Patient tells me she did not have BMP/CBC done 06/17/23 because she relies on transportation and too many people were ahead of her for her to wait to get blood work done. Patient states she does not have transportation to get BMP/CBC done prior to procedures 06/30/23. Patient will plan to arrive at the hospital between 9-9:30 AM 06/30/23 to allow time for lab to be done at hospital morning of procedures.  Patient tells me she does not have any aspirin  81 mg or a way to get aspirin  81 mg ahead of time to take the morning of the procedures before going to hospital.

## 2023-06-29 NOTE — Progress Notes (Signed)
 Unable to reach patient about procedure, but was able to leave a detailed message. Stated that the patient needed to arrive at the hospital at 1030 , remain NPO after 0000, needs to have a ride home and a responsible adult to stay with them for 24 hours after the procedure. Instructed the patient to call back if they had any questions.

## 2023-06-30 ENCOUNTER — Ambulatory Visit (HOSPITAL_COMMUNITY): Payer: Medicaid Other | Admitting: Anesthesiology

## 2023-06-30 ENCOUNTER — Encounter (HOSPITAL_COMMUNITY)
Admission: RE | Disposition: A | Discharge status: Home / Self Care | Payer: Self-pay | Source: Home / Self Care | Attending: Cardiovascular Disease

## 2023-06-30 ENCOUNTER — Encounter (HOSPITAL_COMMUNITY): Payer: Self-pay | Admitting: Cardiovascular Disease

## 2023-06-30 ENCOUNTER — Ambulatory Visit (HOSPITAL_BASED_OUTPATIENT_CLINIC_OR_DEPARTMENT_OTHER)
Admission: RE | Admit: 2023-06-30 | Discharge: 2023-06-30 | Disposition: A | Discharge status: Home / Self Care | Payer: Medicaid Other | Source: Ambulatory Visit | Attending: Cardiovascular Disease | Admitting: Cardiovascular Disease

## 2023-06-30 ENCOUNTER — Ambulatory Visit (HOSPITAL_COMMUNITY)
Admission: RE | Admit: 2023-06-30 | Discharge: 2023-06-30 | Disposition: A | Discharge status: Home / Self Care | Payer: Medicaid Other | Attending: Cardiovascular Disease | Admitting: Cardiovascular Disease

## 2023-06-30 ENCOUNTER — Other Ambulatory Visit: Payer: Self-pay

## 2023-06-30 DIAGNOSIS — I340 Nonrheumatic mitral (valve) insufficiency: Secondary | ICD-10-CM

## 2023-06-30 DIAGNOSIS — I2584 Coronary atherosclerosis due to calcified coronary lesion: Secondary | ICD-10-CM | POA: Insufficient documentation

## 2023-06-30 DIAGNOSIS — Z01812 Encounter for preprocedural laboratory examination: Secondary | ICD-10-CM

## 2023-06-30 DIAGNOSIS — I119 Hypertensive heart disease without heart failure: Secondary | ICD-10-CM | POA: Diagnosis not present

## 2023-06-30 DIAGNOSIS — I2510 Atherosclerotic heart disease of native coronary artery without angina pectoris: Secondary | ICD-10-CM | POA: Insufficient documentation

## 2023-06-30 DIAGNOSIS — Z79899 Other long term (current) drug therapy: Secondary | ICD-10-CM | POA: Diagnosis not present

## 2023-06-30 DIAGNOSIS — I080 Rheumatic disorders of both mitral and aortic valves: Secondary | ICD-10-CM | POA: Insufficient documentation

## 2023-06-30 DIAGNOSIS — I10 Essential (primary) hypertension: Secondary | ICD-10-CM

## 2023-06-30 DIAGNOSIS — R002 Palpitations: Secondary | ICD-10-CM | POA: Insufficient documentation

## 2023-06-30 DIAGNOSIS — F17210 Nicotine dependence, cigarettes, uncomplicated: Secondary | ICD-10-CM | POA: Diagnosis not present

## 2023-06-30 HISTORY — PX: TRANSESOPHAGEAL ECHOCARDIOGRAM (CATH LAB): EP1270

## 2023-06-30 HISTORY — PX: RIGHT/LEFT HEART CATH AND CORONARY ANGIOGRAPHY: CATH118266

## 2023-06-30 LAB — ECHO TEE
MV M vel: 5.76 m/s
MV Peak grad: 132.7 mm[Hg]
Radius: 1 cm

## 2023-06-30 LAB — POCT I-STAT EG7
Acid-base deficit: 4 mmol/L — ABNORMAL HIGH (ref 0.0–2.0)
Bicarbonate: 21.5 mmol/L (ref 20.0–28.0)
Calcium, Ion: 1.18 mmol/L (ref 1.15–1.40)
HCT: 36 % (ref 36.0–46.0)
Hemoglobin: 12.2 g/dL (ref 12.0–15.0)
O2 Saturation: 68 %
Potassium: 3.3 mmol/L — ABNORMAL LOW (ref 3.5–5.1)
Sodium: 132 mmol/L — ABNORMAL LOW (ref 135–145)
TCO2: 23 mmol/L (ref 22–32)
pCO2, Ven: 39.5 mm[Hg] — ABNORMAL LOW (ref 44–60)
pH, Ven: 7.344 (ref 7.25–7.43)
pO2, Ven: 37 mm[Hg] (ref 32–45)

## 2023-06-30 LAB — POCT I-STAT 7, (LYTES, BLD GAS, ICA,H+H)
Acid-base deficit: 3 mmol/L — ABNORMAL HIGH (ref 0.0–2.0)
Bicarbonate: 21.8 mmol/L (ref 20.0–28.0)
Calcium, Ion: 1.23 mmol/L (ref 1.15–1.40)
HCT: 35 % — ABNORMAL LOW (ref 36.0–46.0)
Hemoglobin: 11.9 g/dL — ABNORMAL LOW (ref 12.0–15.0)
O2 Saturation: 94 %
Potassium: 3.5 mmol/L (ref 3.5–5.1)
Sodium: 135 mmol/L (ref 135–145)
TCO2: 23 mmol/L (ref 22–32)
pCO2 arterial: 35.6 mm[Hg] (ref 32–48)
pH, Arterial: 7.395 (ref 7.35–7.45)
pO2, Arterial: 72 mm[Hg] — ABNORMAL LOW (ref 83–108)

## 2023-06-30 LAB — POCT I-STAT, CHEM 8
BUN: 7 mg/dL (ref 6–20)
Calcium, Ion: 1.24 mmol/L (ref 1.15–1.40)
Chloride: 98 mmol/L (ref 98–111)
Creatinine, Ser: 0.6 mg/dL (ref 0.44–1.00)
Glucose, Bld: 101 mg/dL — ABNORMAL HIGH (ref 70–99)
HCT: 42 % (ref 36.0–46.0)
Hemoglobin: 14.3 g/dL (ref 12.0–15.0)
Potassium: 3.6 mmol/L (ref 3.5–5.1)
Sodium: 135 mmol/L (ref 135–145)
TCO2: 24 mmol/L (ref 22–32)

## 2023-06-30 LAB — CBC
HCT: 33.3 % — ABNORMAL LOW (ref 36.0–46.0)
Hemoglobin: 11.1 g/dL — ABNORMAL LOW (ref 12.0–15.0)
MCH: 23.6 pg — ABNORMAL LOW (ref 26.0–34.0)
MCHC: 33.3 g/dL (ref 30.0–36.0)
MCV: 70.9 fL — ABNORMAL LOW (ref 80.0–100.0)
Platelets: 255 10*3/uL (ref 150–400)
RBC: 4.7 MIL/uL (ref 3.87–5.11)
RDW: 14.1 % (ref 11.5–15.5)
WBC: 5.4 10*3/uL (ref 4.0–10.5)
nRBC: 0 % (ref 0.0–0.2)

## 2023-06-30 LAB — BASIC METABOLIC PANEL
Anion gap: 4 — ABNORMAL LOW (ref 5–15)
BUN: 8 mg/dL (ref 6–20)
CO2: 25 mmol/L (ref 22–32)
Calcium: 8.4 mg/dL — ABNORMAL LOW (ref 8.9–10.3)
Chloride: 103 mmol/L (ref 98–111)
Creatinine, Ser: 0.59 mg/dL (ref 0.44–1.00)
GFR, Estimated: >60 mL/min (ref >60)
Glucose, Bld: 101 mg/dL — ABNORMAL HIGH (ref 70–99)
Potassium: 4.1 mmol/L (ref 3.5–5.1)
Sodium: 132 mmol/L — ABNORMAL LOW (ref 135–145)

## 2023-06-30 SURGERY — RIGHT/LEFT HEART CATH AND CORONARY ANGIOGRAPHY
Anesthesia: LOCAL

## 2023-06-30 SURGERY — TRANSESOPHAGEAL ECHOCARDIOGRAM (TEE) (CATHLAB)
Anesthesia: Monitor Anesthesia Care

## 2023-06-30 MED ORDER — PROPOFOL 10 MG/ML IV BOLUS
INTRAVENOUS | Status: DC | PRN
  Administered 2023-06-30: 20 mg via INTRAVENOUS
  Administered 2023-06-30: 30 mg via INTRAVENOUS
  Administered 2023-06-30: 50 mg via INTRAVENOUS

## 2023-06-30 MED ORDER — LIDOCAINE HCL (PF) 1 % IJ SOLN
INTRAMUSCULAR | Status: AC
  Filled 2023-06-30: qty 30

## 2023-06-30 MED ORDER — VERAPAMIL HCL 2.5 MG/ML IV SOLN
INTRAVENOUS | Status: AC
  Filled 2023-06-30: qty 2

## 2023-06-30 MED ORDER — MIDAZOLAM HCL 2 MG/2ML IJ SOLN
INTRAMUSCULAR | Status: AC
  Filled 2023-06-30: qty 2

## 2023-06-30 MED ORDER — MIDAZOLAM HCL 2 MG/2ML IJ SOLN
INTRAMUSCULAR | Status: DC | PRN
  Administered 2023-06-30: 1 mg via INTRAVENOUS

## 2023-06-30 MED ORDER — PROPOFOL 500 MG/50ML IV EMUL
INTRAVENOUS | Status: DC | PRN
  Administered 2023-06-30: 70 ug/kg/min via INTRAVENOUS

## 2023-06-30 MED ORDER — ONDANSETRON HCL 4 MG/2ML IJ SOLN: 4.0000 mg | Freq: Four times a day (QID) | INTRAMUSCULAR | Status: DC | PRN

## 2023-06-30 MED ORDER — FENTANYL CITRATE (PF) 100 MCG/2ML IJ SOLN
INTRAMUSCULAR | Status: AC
  Filled 2023-06-30: qty 2

## 2023-06-30 MED ORDER — HEPARIN (PORCINE) IN NACL 2-0.9 UNITS/ML
INTRAMUSCULAR | Status: DC | PRN
  Administered 2023-06-30: 10 mL via INTRA_ARTERIAL

## 2023-06-30 MED ORDER — ACETAMINOPHEN 325 MG PO TABS: 650.0000 mg | ORAL_TABLET | ORAL | Status: DC | PRN

## 2023-06-30 MED ORDER — LABETALOL HCL 5 MG/ML IV SOLN: 10.0000 mg | INTRAVENOUS | Status: DC | PRN

## 2023-06-30 MED ORDER — LIDOCAINE HCL (PF) 1 % IJ SOLN
INTRAMUSCULAR | Status: DC | PRN
  Administered 2023-06-30 (×2): 2 mL via INTRADERMAL

## 2023-06-30 MED ORDER — ASPIRIN 81 MG PO CHEW
CHEWABLE_TABLET | ORAL | Status: AC
  Administered 2023-06-30: 81 mg via ORAL
  Filled 2023-06-30: qty 1

## 2023-06-30 MED ORDER — HEPARIN (PORCINE) IN NACL 1000-0.9 UT/500ML-% IV SOLN
INTRAVENOUS | Status: DC | PRN
  Administered 2023-06-30 (×2): 500 mL

## 2023-06-30 MED ORDER — IOHEXOL 350 MG/ML SOLN
INTRAVENOUS | Status: DC | PRN
  Administered 2023-06-30: 35 mL

## 2023-06-30 MED ORDER — LIDOCAINE 2% (20 MG/ML) 5 ML SYRINGE
INTRAMUSCULAR | Status: DC | PRN
  Administered 2023-06-30: 100 mg via INTRAVENOUS

## 2023-06-30 MED ORDER — SODIUM CHLORIDE 0.9 % WEIGHT BASED INFUSION: 1.0000 mL/kg/h | INTRAVENOUS | Status: DC

## 2023-06-30 MED ORDER — HYDRALAZINE HCL 20 MG/ML IJ SOLN: 10.0000 mg | INTRAMUSCULAR | Status: DC | PRN

## 2023-06-30 MED ORDER — SODIUM CHLORIDE 0.9 % IV SOLN: INTRAVENOUS | Status: DC

## 2023-06-30 MED ORDER — HEPARIN SODIUM (PORCINE) 1000 UNIT/ML IJ SOLN
INTRAMUSCULAR | Status: AC
  Filled 2023-06-30: qty 10

## 2023-06-30 MED ORDER — SODIUM CHLORIDE 0.9 % WEIGHT BASED INFUSION
3.0000 mL/kg/h | INTRAVENOUS | Status: AC
  Administered 2023-06-30: 3 mL/kg/h via INTRAVENOUS

## 2023-06-30 MED ORDER — SODIUM CHLORIDE 0.9% FLUSH: 3.0000 mL | Freq: Two times a day (BID) | INTRAVENOUS | Status: DC

## 2023-06-30 MED ORDER — SODIUM CHLORIDE 0.9 % IV SOLN: 250.0000 mL | INTRAVENOUS | Status: DC | PRN

## 2023-06-30 MED ORDER — FENTANYL CITRATE (PF) 100 MCG/2ML IJ SOLN
INTRAMUSCULAR | Status: DC | PRN
  Administered 2023-06-30: 25 ug via INTRAVENOUS

## 2023-06-30 MED ORDER — ASPIRIN 81 MG PO CHEW: 81.0000 mg | CHEWABLE_TABLET | ORAL | Status: AC

## 2023-06-30 MED ORDER — HEPARIN SODIUM (PORCINE) 1000 UNIT/ML IJ SOLN
INTRAMUSCULAR | Status: DC | PRN
  Administered 2023-06-30: 3000 [IU] via INTRAVENOUS

## 2023-06-30 MED ORDER — SODIUM CHLORIDE 0.9% FLUSH: 3.0000 mL | INTRAVENOUS | Status: DC | PRN

## 2023-06-30 SURGICAL SUPPLY — 10 items
CATH 5FR JL3.5 JR4 ANG PIG MP (CATHETERS) IMPLANT
CATH BALLN WEDGE 5F 110CM (CATHETERS) IMPLANT
DEVICE RAD COMP TR BAND LRG (VASCULAR PRODUCTS) IMPLANT
GLIDESHEATH SLEND SS 6F .021 (SHEATH) IMPLANT
GUIDEWIRE INQWIRE 1.5J.035X260 (WIRE) IMPLANT
INQWIRE 1.5J .035X260CM (WIRE) ×1 IMPLANT
PACK CARDIAC CATHETERIZATION (CUSTOM PROCEDURE TRAY) ×1 IMPLANT
SET ATX-X65L (MISCELLANEOUS) IMPLANT
SHEATH GLIDE SLENDER 4/5FR (SHEATH) IMPLANT
SHEATH PROBE COVER 6X72 (BAG) IMPLANT

## 2023-06-30 NOTE — Discharge Instructions (Signed)

## 2023-06-30 NOTE — Interval H&P Note (Signed)
History and Physical Interval Note:  06/30/2023 8:29 AM  Regina Daugherty  has presented today for surgery, with the diagnosis of MR.  The various methods of treatment have been discussed with the patient and family. After consideration of risks, benefits and other options for treatment, the patient has consented to  Procedure(s): TRANSESOPHAGEAL ECHOCARDIOGRAM (N/A) as a surgical intervention.  The patient's history has been reviewed, patient examined, no change in status, stable for surgery.  I have reviewed the patient's chart and labs.  Questions were answered to the patient's satisfaction.     Charlton Haws

## 2023-06-30 NOTE — Anesthesia Preprocedure Evaluation (Signed)
Anesthesia Evaluation  Patient identified by MRN, date of birth, ID band Patient awake    Reviewed: Allergy & Precautions, NPO status , Patient's Chart, lab work & pertinent test results  History of Anesthesia Complications Negative for: history of anesthetic complications  Airway Mallampati: II  TM Distance: >3 FB Neck ROM: Full    Dental  (+) Edentulous Upper, Edentulous Lower, Dental Advisory Given   Pulmonary neg shortness of breath, neg sleep apnea, neg COPD, neg recent URI, Current Smoker and Patient abstained from smoking.   breath sounds clear to auscultation       Cardiovascular hypertension, Pt. on medications (-) angina (-) Past MI + Valvular Problems/Murmurs MR  Rhythm:Regular  1. Left ventricular ejection fraction, by estimation, is 60 to 65%. Left  ventricular ejection fraction by 3D volume is 61 %. The left ventricle has  normal function. The left ventricle has no regional wall motion  abnormalities. There is severe concentric   left ventricular hypertrophy. Left ventricular diastolic parameters are  consistent with Grade I diastolic dysfunction (impaired relaxation).  Elevated left ventricular end-diastolic pressure. The average left  ventricular global longitudinal strain is  -19.0 %. The global longitudinal strain is normal.   2. Right ventricular systolic function is normal. The right ventricular  size is normal. Tricuspid regurgitation signal is inadequate for assessing  PA pressure.   3. The mitral valve is normal in structure. Moderate mitral valve  regurgitation. No evidence of mitral stenosis.   4. The aortic valve is tricuspid. Aortic valve regurgitation is mild to  moderate. Aortic valve sclerosis/calcification is present, without any  evidence of aortic stenosis. Aortic regurgitation PHT measures 536 msec.   5. Aortic dilatation noted. There is mild dilatation of the ascending  aorta, measuring 43 mm.    6. The inferior vena cava is normal in size with greater than 50%  respiratory variability, suggesting right atrial pressure of 3 mmHg.     Neuro/Psych negative neurological ROS  negative psych ROS   GI/Hepatic Neg liver ROS,,,  Endo/Other  negative endocrine ROS    Renal/GU negative Renal ROSLab Results      Component                Value               Date                      NA                       135                 06/30/2023                K                        3.6                 06/30/2023                CO2                      24                  04/14/2023                GLUCOSE  101 (H)             06/30/2023                BUN                      7                   06/30/2023                CREATININE               0.60                06/30/2023                CALCIUM                  9.6                 04/14/2023                EGFR                     95                  04/14/2023                Musculoskeletal  (+) Arthritis ,    Abdominal   Peds  Hematology negative hematology ROS (+) Lab Results      Component                Value               Date                      WBC                      7.5                 11/18/2022                HGB                      14.3                06/30/2023                HCT                      42.0                06/30/2023                MCV                      72 (L)              11/18/2022                PLT                      283                 11/18/2022              Anesthesia Other Findings   Reproductive/Obstetrics  Anesthesia Physical Anesthesia Plan  ASA: 2  Anesthesia Plan: MAC   Post-op Pain Management: Minimal or no pain anticipated   Induction: Intravenous  PONV Risk Score and Plan: 1 and Propofol infusion and Treatment may vary due to age or medical condition  Airway Management Planned: Nasal Cannula,  Natural Airway and Simple Face Mask  Additional Equipment: None  Intra-op Plan:   Post-operative Plan:   Informed Consent: I have reviewed the patients History and Physical, chart, labs and discussed the procedure including the risks, benefits and alternatives for the proposed anesthesia with the patient or authorized representative who has indicated his/her understanding and acceptance.     Dental advisory given  Plan Discussed with: CRNA  Anesthesia Plan Comments:          Anesthesia Quick Evaluation

## 2023-06-30 NOTE — CV Procedure (Signed)
TEE Anesthesia Propofol  EF 55-60% Severe MR restricted posterior leaflet  Mild AR/AV sclerosis Mild TR Moderate LAE No LAA thrombus Mild aortic thoracic dilatation 3.9 cm  Normal RV Trivial RV anterior effusion No PFO/ASD  See full report in [facility]  Charlton Haws MD [facility]

## 2023-06-30 NOTE — Interval H&P Note (Signed)
History and Physical Interval Note:  06/30/2023 3:29 PM  Regina Daugherty  has presented today for surgery, with the diagnosis of MR.  The various methods of treatment have been discussed with the patient and family. After consideration of risks, benefits and other options for treatment, the patient has consented to  Procedure(s): RIGHT/LEFT HEART CATH AND CORONARY ANGIOGRAPHY (N/A) as a surgical intervention.  The patient's history has been reviewed, patient examined, no change in status, stable for surgery.  I have reviewed the patient's chart and labs.  Questions were answered to the patient's satisfaction.     Tonny Bollman

## 2023-06-30 NOTE — Transfer of Care (Signed)
Immediate Anesthesia Transfer of Care Note  Patient: Regina Daugherty  Procedure(s) Performed: TRANSESOPHAGEAL ECHOCARDIOGRAM  Patient Location: PACU and Cath Lab  Anesthesia Type:MAC  Level of Consciousness: awake and patient cooperative  Airway & Oxygen Therapy: Patient Spontanous Breathing and Patient connected to nasal cannula oxygen  Post-op Assessment: Report given to RN and Post -op Vital signs reviewed and stable  Post vital signs: Reviewed and stable  Last Vitals:  Vitals Value Taken Time  BP    Temp    Pulse    Resp    SpO2      Last Pain:  Vitals:   06/30/23 1009  TempSrc:   PainSc: 0-No pain         Complications: No notable events documented.

## 2023-07-01 ENCOUNTER — Telehealth: Payer: Self-pay

## 2023-07-01 ENCOUNTER — Encounter (HOSPITAL_COMMUNITY): Payer: Self-pay | Admitting: Cardiovascular Disease

## 2023-07-01 NOTE — Telephone Encounter (Signed)
Regina Daugherty

## 2023-07-01 NOTE — Telephone Encounter (Signed)
-----   Message from Orbie Pyo sent at 06/30/2023  5:23 PM EST ----- Thanks Kathlene November.  Michalene, can you get her in to see me to discuss further?  Thanks. ----- Message ----- From: Tonny Bollman, MD Sent: 06/30/2023   4:42 PM EST To: Henrietta Dine, RN; Donell Beers, FNP; #  Please see attached cath note. Thx Kathlene November

## 2023-07-01 NOTE — Anesthesia Postprocedure Evaluation (Signed)
Anesthesia Post Note  Patient: Regina Daugherty  Procedure(s) Performed: TRANSESOPHAGEAL ECHOCARDIOGRAM     Patient location during evaluation: Cath Lab Anesthesia Type: MAC Level of consciousness: awake and alert Pain management: pain level controlled Vital Signs Assessment: post-procedure vital signs reviewed and stable Respiratory status: spontaneous breathing, nonlabored ventilation and respiratory function stable Cardiovascular status: stable and blood pressure returned to baseline Postop Assessment: no apparent nausea or vomiting Anesthetic complications: no   No notable events documented.               Kurk Corniel

## 2023-07-01 NOTE — Telephone Encounter (Signed)
Scheduled the patient for visit with Dr. Lynnette Caffey 07/13/2023. She was grateful for call and agreed with plan.

## 2023-07-07 DIAGNOSIS — Z7689 Persons encountering health services in other specified circumstances: Secondary | ICD-10-CM | POA: Diagnosis not present

## 2023-07-09 ENCOUNTER — Telehealth: Payer: Self-pay

## 2023-07-09 NOTE — Telephone Encounter (Signed)
"  Racheal Daza:  Fossa looks good for transseptal approach in the bicaval and SAXB views. Enough room within the LA to achieve device straddle and maneuver down to the valve plane. 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 The jet appears central on the bicom and 3D views. In the SAX transgastric, the color appears to extend from the central portion into the A1P1 region. Will want to confirm the extent of the jet with a bicom/x-plane sweep prior to the procedure. Posterior leaflet length measured in multiple views >1.0cm. Valve area seen at 4.4cm2 with a gradient of at 83bpm. Plan on placing the first clip central at A2P2, possibly a shade lateral, and see how the MR responds. XT/XTW appropriate."

## 2023-07-10 ENCOUNTER — Other Ambulatory Visit: Payer: Self-pay | Admitting: Nurse Practitioner

## 2023-07-10 DIAGNOSIS — G8929 Other chronic pain: Secondary | ICD-10-CM

## 2023-07-10 MED ORDER — CYCLOBENZAPRINE HCL 5 MG PO TABS: 5.0000 mg | ORAL_TABLET | Freq: Every evening | ORAL | 0 refills | Status: DC | PRN

## 2023-07-10 MED ORDER — DICLOFENAC SODIUM 1 % EX GEL: 4.0000 g | Freq: Four times a day (QID) | CUTANEOUS | 1 refills | Status: DC

## 2023-07-10 MED ORDER — AMLODIPINE BESYLATE 5 MG PO TABS: 5.0000 mg | ORAL_TABLET | Freq: Every day | ORAL | 1 refills | Status: DC

## 2023-07-10 NOTE — Telephone Encounter (Signed)
 Copied from CRM [id number]. Topic: Clinical - Medication Refill >> Jul 10, 2023 10:25 AM Dennison Nancy wrote: Most Recent Primary Care Visit:  Provider: Nilda Simmer T  Department: [facility]  Visit Type: PATIENT OUTREACH 30  Date: 04/14/2023  Medication: diclofenac Sodium (VOLTAREN) 1 % GEL,cyclobenzaprine (FLEXERIL) 5 MG tablet,amLODipine (NORVASC) 5 MG tablet  Has the patient contacted their pharmacy? no (Agent: If no, request that the patient contact the pharmacy for the refill. If patient does not wish to contact the pharmacy document the reason why and proceed with request.) (Agent: If yes, when and what did the pharmacy advise?)  Is this the correct pharmacy for this prescription? Yes If no, delete pharmacy and type the correct one.  This is the patient's preferred pharmacy:  [facility] [address], [place] - [address] AT [facility] [address] [place] [address] Phone: [phone] Fax: [phone]  Has the prescription been filled recently? No  Is the patient out of the medication? Yes  Has the patient been seen for an appointment in the last year OR does the patient have an upcoming appointment? Yes  Can we respond through MyChart? No  Agent: Please be advised that Rx refills may take up to 3 business days. We ask that you follow-up with your pharmacy.

## 2023-07-10 NOTE — Progress Notes (Signed)
 Cardiology Office Note:   Date:  07/13/2023  ID:  Regina Daugherty, DOB 01-20-1966, MRN [id number] PCP:  Donell Beers, FNP  [facility] Providers Cardiologist:  Alverda Skeans, MD Referring MD: Donell Beers, FNP  Chief Complaint/Reason for Referral: Cardiology follow-up ASSESSMENT:    1. Mitral valve insufficiency, unspecified etiology   2. Diastolic dysfunction without heart failure   3. Essential hypertension, benign   4. Palpitations   5. Tobacco abuse counseling   6. Coronary artery disease involving native coronary artery of native heart without angina pectoris   7. Hyperlipidemia LDL goal <70      PLAN:   In order of problems listed above: Mitral regurgitation: CMR and TEE  demonstrates severe mitral regurgitation.  She has no obstructive coronary artery disease.  She does not have pulmonary hypertension or atrial fibrillation.  While her coronary angiography study demonstrated no obstructive disease or occlusions I wonder whether she had a silent MI in the posterolateral territory that recanalized.  However she has no obvious Q waves on her EKG.  Will refer to Dr. Leafy Ro for recommendations regarding mitral valve repair.  She is a relatively young patient with a normal ejection fraction and no  serious comorbidities. Diastolic dysfunction: Continue losartan 25 mg daily and Jardiance 10 mg daily.   Hypertension:  BP is well controlled today, continue amlodipine 5mg , losartan 25mg , and chlorthalidone 25 mg. Palpitations: No longer seems to be an issue. Tobacco abuse:  Trying to quit with herbal supplements. Coronary artery disease: Start aspirin 81 mg and atorvastatin 20 mg. Hyperlipidemia: Start atorvastatin 20 mg check lipid panel, LFTs, and LP(a) in 2 months.        Informed Consent   Shared Decision Making/Informed Consent         Dispo:  Return in about 6 months (around 01/10/2024).      Medication Adjustments/Labs and Tests Ordered: Current  medicines are reviewed at length with the patient today.  Concerns regarding medicines are outlined above.  The following changes have been made:     Labs/tests ordered: Orders Placed This Encounter  Procedures   Lipid panel   Hepatic function panel   Lipoprotein A (LPA)   Ambulatory referral to Cardiothoracic Surgery   EKG 12-Lead    Medication Changes: Meds ordered this encounter  Medications   aspirin EC 81 MG tablet    Sig: Take 1 tablet (81 mg total) by mouth daily. Swallow whole.   atorvastatin (LIPITOR) 20 MG tablet    Sig: Take 1 tablet (20 mg total) by mouth daily.    Dispense:  90 tablet    Refill:  3    Current medicines are reviewed at length with the patient today.  The patient does not have concerns regarding medicines.  I spent 32 minutes reviewing all clinical data during and prior to this visit including all relevant imaging studies, laboratories, clinical information from other health systems and prior notes from both Cardiology and other specialties, interviewing the patient, conducting a complete physical examination, and coordinating care in order to formulate a comprehensive and personalized evaluation and treatment plan.   History of Present Illness:      FOCUSED PROBLEM LIST:    Hypertension Tobacco abuse Diastolic dysfunction Severe LVH, EF 60 to 65%, moderate MR TTE October 2024 EF 61%; inferolateral perfusion defect + severe MR restricted PMVL CMR December 2024 CMR c/w CAD versus variant HOCM versus sarcoidosis Mitral regurgitation Moderate TTE October 2024 Severe due to restricted PMVL CMR December  2024 Severe, restricted posterior leaflet TEE February 2025 CAD Mild, nonobstructive coronary angiography February 2025  10/24: The patient is a 58 y.o. female with the indicated medical history here for recommendations regarding palpitations.  The patient was seen by her primary care provider in July.  At that point in time she was complaining of  dizziness.  An EKG at that time demonstrated left ventricular hypertrophy and possible left atrial enlargement.  For this reason she was referred to cardiology.  Labs at that time including a TSH, CMP, and CBC were within normal limits.    The patient tells me that her palpitations have resolved.  She tells me that this was due to the fact she was taking her blood pressure medications.  She tells me she is doing well.  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  She denies any exertional angina, exertional dyspnea, presyncope, palpitations, paroxysmal nocturnal dyspnea, orthopnea.  She does continue to smoke however she is down to about 6 cigarettes a week.  She denies any claudication symptoms.  She is trying to quit and is on a NicoDerm patch.  Plan: Obtain echocardiogram and monitor; start chlorthalidone 25 mg and check BMP in 1 week.  Nurse blood pressure check in 1 week.  1/25: In the interim the patient did not have a monitor completed.  Her echocardiogram demonstrated severe LVH and she was referred for cardiac MRI which showed severe mitral valve regurgitation and a perfusion defect in the inferolateral lateral wall.  She is here to discuss those findings.  She actually feels quite well.  She is nervous today about the findings on of her CMR.  She denies any chest pain or shortness of breath.  She is able to go up and down stairs without any issues.  She is surprised that her CMR demonstrated severe mitral regurgitation and possibly an old myocardial infarction.  She has been compliant with her medical therapy without issues.  Plan: Refer for TEE, coronary angiography, and right heart catheterization.  2/25: The patient had a TEE which confirmed severe mitral regurgitation likely due to posterior restricted leaflet.  Her coronary angiography study demonstrated mild nonobstructive disease in her right heart dynamics were relatively normal with no giant V waves seen.  She  returns to discuss these findings.  The patient denies any significant shortness of breath.  Her palpitations are much improved.  She is very anxious about the findings of her studies.  She denies any chest pain, paroxysmal nocturnal dyspnea, or orthopnea, severe bleeding or bruising, or signs or symptoms of stroke.  She has tried to quit smoking.  She has been abstaining from smoking for a few weeks and is using an herbal supplement to help with this.  She is otherwise well and without complaints.  She has been completely compliant with her medical therapy.             Current Medications: Current Meds  Medication Sig   amLODipine (NORVASC) 5 MG tablet Take 1 tablet (5 mg total) by mouth daily.   aspirin EC 81 MG tablet Take 1 tablet (81 mg total) by mouth daily. Swallow whole.   atorvastatin (LIPITOR) 20 MG tablet Take 1 tablet (20 mg total) by mouth daily.   chlorthalidone (HYGROTON) 25 MG tablet Take 25 mg by mouth daily.   cyclobenzaprine (FLEXERIL) 5 MG tablet Take 1 tablet (5 mg total) by mouth at bedtime as needed for muscle spasms.   diclofenac  Sodium (VOLTAREN) 1 % GEL Apply 4 g topically 4 (four) times daily.   empagliflozin (JARDIANCE) 10 MG TABS tablet Take 1 tablet (10 mg total) by mouth daily before breakfast.   losartan (COZAAR) 25 MG tablet Take 1 tablet (25 mg total) by mouth daily.     Review of Systems:   Please see the history of present illness.    All other systems reviewed and are negative.     EKGs/Labs/Other Test Reviewed:   EKG: EKG from January 2025 demonstrates sinus tachycardia with rightward axis  EKG Interpretation Date/Time:  Monday July 13 2023 09:00:37 EST Ventricular Rate:  120 PR Interval:  152 QRS Duration:  82 QT Interval:  336 QTC Calculation: 474 R Axis:   71  Text Interpretation: Sinus tachycardia Possible Anterior infarct , age undetermined When compared with ECG of 17-Jun-2023 09:02, No significant change was found Confirmed by  Alverda Skeans ([id number]) on 07/13/2023 9:05:39 AM         Risk Assessment/Calculations:          Physical Exam:   VS:  BP 124/72   Pulse (!) 120   Ht 5\' 6"  (1.676 m)   Wt 119 lb 3.2 oz (54.1 kg)   SpO2 99%   BMI 19.24 kg/m        Wt Readings from Last 3 Encounters:  07/13/23 119 lb 3.2 oz (54.1 kg)  06/30/23 120 lb (54.4 kg)  06/17/23 122 lb 3.2 oz (55.4 kg)      GENERAL:  No apparent distress, AOx3 HEENT:  No carotid bruits, +2 carotid impulses, no scleral icterus CAR: Tachycardic no murmurs, gallops, rubs, or thrills RES:  Clear to auscultation bilaterally ABD:  Soft, nontender, nondistended, positive bowel sounds x 4 VASC:  +2 radial pulses, +2 carotid pulses NEURO:  CN 2-12 grossly intact; motor and sensory grossly intact PSYCH:  No active depression or anxiety EXT:  No edema, ecchymosis, or cyanosis  Signed, Orbie Pyo, MD  07/13/2023 9:24 AM    [facility] [address] [place], [place], [place]  [address] Phone: [phone]; Fax: [phone]   Note:  This document was prepared using Dragon voice recognition software and may include unintentional dictation errors.

## 2023-07-13 ENCOUNTER — Encounter: Payer: Self-pay | Admitting: Internal Medicine

## 2023-07-13 ENCOUNTER — Ambulatory Visit: Payer: Medicaid Other | Attending: Internal Medicine | Admitting: Internal Medicine

## 2023-07-13 VITALS — BP 124/72 | HR 120 | Ht 66.0 in | Wt 119.2 lb

## 2023-07-13 DIAGNOSIS — R002 Palpitations: Secondary | ICD-10-CM

## 2023-07-13 DIAGNOSIS — I2510 Atherosclerotic heart disease of native coronary artery without angina pectoris: Secondary | ICD-10-CM | POA: Diagnosis not present

## 2023-07-13 DIAGNOSIS — I10 Essential (primary) hypertension: Secondary | ICD-10-CM | POA: Diagnosis not present

## 2023-07-13 DIAGNOSIS — I340 Nonrheumatic mitral (valve) insufficiency: Secondary | ICD-10-CM | POA: Diagnosis not present

## 2023-07-13 DIAGNOSIS — E785 Hyperlipidemia, unspecified: Secondary | ICD-10-CM | POA: Diagnosis not present

## 2023-07-13 DIAGNOSIS — Z716 Tobacco abuse counseling: Secondary | ICD-10-CM

## 2023-07-13 DIAGNOSIS — I5189 Other ill-defined heart diseases: Secondary | ICD-10-CM | POA: Diagnosis not present

## 2023-07-13 DIAGNOSIS — Z7689 Persons encountering health services in other specified circumstances: Secondary | ICD-10-CM | POA: Diagnosis not present

## 2023-07-13 MED ORDER — ATORVASTATIN CALCIUM 20 MG PO TABS: 20.0000 mg | ORAL_TABLET | Freq: Every day | ORAL | 3 refills | Status: AC

## 2023-07-13 MED ORDER — ASPIRIN 81 MG PO TBEC
81.0000 mg | DELAYED_RELEASE_TABLET | Freq: Every day | ORAL | Status: AC
Start: 2023-07-13 — End: ?

## 2023-07-13 NOTE — Patient Instructions (Addendum)
 Medication Instructions:  Your physician has recommended you make the following change in your medication:   1) START aspirin 81 mg daily (this can be purchased over the counter) 2) START atorvastatin 20 mg daily  *If you need a refill on your cardiac medications before your next appointment, please call your pharmacy*  Lab Work: In 2 months at [facility]: Lipid panel, LFTs, LP (a) You may go to any of these [facility] locations:   [facility] - [id number] ([facility]) - [address] [phone] [address]   If you have labs (blood work) drawn today and your tests are completely normal, you will receive your results only by: [employer] (if you have MyChart) OR A paper copy in the mail If you have any lab test that is abnormal or we need to change your treatment, we will call you to review the results.  Testing/Procedures: None ordered today.  Follow-Up: At [facility], you and your health needs are our priority.  As part of our continuing mission to provide you with exceptional heart care, we have created designated Provider Care Teams.  These Care Teams include your primary Cardiologist (physician) and Advanced Practice Providers (APPs -  Physician Assistants and Nurse Practitioners) who all work together to provide you with the care you need, when you need it.  Your next appointment:   6 month(s)  The format for your next appointment:   In Person  Provider:   Orbie Pyo, MD {  Other Instructions You have been referred to Dr. Eugenio Hoes, his office will contact you to schedule an appointment.  Contact Information: [facility] [address] [place], [place] [address] [phone]    1st Floor: - Lobby - Registration  - Pharmacy  - Lab - Cafe  2nd Floor: - PV Lab - Diagnostic Testing (echo, CT, nuclear med)  3rd Floor: - Vacant  4th Floor: - TCTS  (cardiothoracic surgery) - AFib Clinic - Structural Heart Clinic - Vascular Surgery  - Vascular Ultrasound  5th Floor: - HeartCare Cardiology (general and EP) - Clinical Pharmacy for coumadin, hypertension, lipid, weight-loss medications, and med management appointments    Valet parking services will be available as well.

## 2023-07-17 ENCOUNTER — Encounter: Payer: Self-pay | Admitting: Nurse Practitioner

## 2023-07-17 ENCOUNTER — Ambulatory Visit (INDEPENDENT_AMBULATORY_CARE_PROVIDER_SITE_OTHER): Payer: Medicaid Other | Admitting: Nurse Practitioner

## 2023-07-17 VITALS — BP 123/75 | HR 105 | Temp 98.1°F | Wt 120.0 lb

## 2023-07-17 DIAGNOSIS — E785 Hyperlipidemia, unspecified: Secondary | ICD-10-CM

## 2023-07-17 DIAGNOSIS — I5189 Other ill-defined heart diseases: Secondary | ICD-10-CM | POA: Diagnosis not present

## 2023-07-17 DIAGNOSIS — I10 Essential (primary) hypertension: Secondary | ICD-10-CM

## 2023-07-17 DIAGNOSIS — Z720 Tobacco use: Secondary | ICD-10-CM

## 2023-07-17 DIAGNOSIS — I340 Nonrheumatic mitral (valve) insufficiency: Secondary | ICD-10-CM

## 2023-07-17 NOTE — Assessment & Plan Note (Signed)
 Continue Jardiance 10 mg daily.

## 2023-07-17 NOTE — Patient Instructions (Signed)
 Please consider getting pneumococcal ,Shingrix and Tdap vaccine at local pharmacy.   [facility] That Accept Medicaid and/or [facility]  [facility] [address], [address]  [place], [place] [address] [phone] [email or web address]   [facility] [address] [place], [place] [address] [phone] [email or web address]   [facility] [place], [place] [address] [place], [place] [address] Located next to [employer] Phone: [phone] [facility], [facility] [address] [place], [place] [address] Located next to [employer] Phone: [phone] [email or web address]   [facility] [address] [place], [place] [address] [phone] [email or web address]   [facility] [address] [place], [place] [address] [phone] [email or web address]   [facility] [address]  [place], [place] [address] [phone] [email or web address]  ,  It is important that you exercise regularly at least 30 minutes 5 times a week as tolerated  Think about what you will eat, plan ahead. Choose " clean, green, fresh or frozen" over canned, processed or packaged foods which are more sugary, salty and fatty. 70 to 75% of food eaten should be vegetables and fruit. Three meals at set times with snacks allowed between meals, but they must be fruit or vegetables. Aim to eat over a 12 hour period , example 7 am to 7 pm, and STOP after  your last meal of the day. Drink water,generally about 64 ounces per day, no other drink is as healthy. Fruit juice is best enjoyed in a healthy way, by EATING the fruit.  Thanks for choosing [facility] we consider it a privelige to serve you.

## 2023-07-17 NOTE — Assessment & Plan Note (Signed)
 Has upcoming mitral valve replacement Patient encouraged to maintain close follow-up with cardiology

## 2023-07-17 NOTE — Assessment & Plan Note (Deleted)
 Smoking cessation encouraged!

## 2023-07-17 NOTE — Assessment & Plan Note (Signed)
 BP Readings from Last 3 Encounters:  07/17/23 123/75  07/13/23 124/72  06/30/23 (!) 148/83   HTN Controlled .  On amlodipine 5 mg daily, losartan 25 mg daily, chlorthalidone 25 mg daily Continue current medications. No changes in management. Discussed DASH diet and dietary sodium restrictions Continue to increase dietary efforts and exercise.  Appreciate collaboration with cardiology

## 2023-07-17 NOTE — Progress Notes (Signed)
 Acute Office Visit  Subjective:     Patient ID: Regina Daugherty, female    DOB: 1965-05-31, 58 y.o.   MRN: [id number]  Chief Complaint  Patient presents with   Consult    HPI Patient is in today to discusses her recent visits with the cardiologist, stated that she wanted to make sure I was getting notes from the office regarding the care she is receiving from them. Stated that she has been taking all medications as ordered and does not have any complaints today Has upcoming mitral valve repair  Hypertension.  Currently on amlodipine 5 mg daily, losartan 25 mg daily and chlorthalidone 25 mg daily.  Patient denies chest pain, shortness of breath, edema  CAD .she was started on atorvastatin 20 mg daily and aspirin 81 mg daily, states that she will go to the pharmacy and pick up the medications  Tobacco abuse.  Stated that she has been cutting back on smoking cigarettes, she is now using an herbal supplements to assist with smoking cessation. Top and bott  Patient encouraged to get pneumococcal vaccine, Tdap vaccine and shingles vaccine at the pharmacy  Review of Systems  Constitutional:  Negative for appetite change, chills, fatigue and fever.  HENT:  Negative for congestion, postnasal drip, rhinorrhea and sneezing.   Respiratory:  Negative for cough, shortness of breath and wheezing.   Cardiovascular:  Negative for chest pain, palpitations and leg swelling.  Gastrointestinal:  Negative for abdominal pain, constipation, nausea and vomiting.  Genitourinary:  Negative for difficulty urinating, dysuria, flank pain and frequency.  Musculoskeletal:  Negative for arthralgias, back pain, joint swelling and myalgias.  Skin:  Negative for color change, pallor, rash and wound.  Neurological:  Negative for dizziness, facial asymmetry, weakness, numbness and headaches.  Psychiatric/Behavioral:  Negative for behavioral problems, confusion, self-injury and suicidal ideas.         Objective:     BP 123/75   Pulse (!) 105   Temp 98.1 F (36.7 C)   Wt 120 lb (54.4 kg)   BMI 19.37 kg/m    Physical Exam Vitals and nursing note reviewed.  Constitutional:      General: She is not in acute distress.    Appearance: Normal appearance. She is not ill-appearing, toxic-appearing or diaphoretic.  HENT:     Mouth/Throat:     Mouth: Mucous membranes are moist.     Pharynx: Oropharynx is clear. No oropharyngeal exudate or posterior oropharyngeal erythema.  Eyes:     General: No scleral icterus.       Right eye: No discharge.        Left eye: No discharge.     Extraocular Movements: Extraocular movements intact.     Conjunctiva/sclera: Conjunctivae normal.  Cardiovascular:     Rate and Rhythm: Normal rate and regular rhythm.     Pulses: Normal pulses.     Heart sounds: Normal heart sounds. No murmur heard.    No friction rub. No gallop.  Pulmonary:     Effort: Pulmonary effort is normal. No respiratory distress.     Breath sounds: Normal breath sounds. No stridor. No wheezing, rhonchi or rales.  Chest:     Chest wall: No tenderness.  Abdominal:     General: There is no distension.     Palpations: Abdomen is soft.     Tenderness: There is no abdominal tenderness. There is no right CVA tenderness, left CVA tenderness or guarding.  Musculoskeletal:        General:  No swelling, tenderness, deformity or signs of injury.     Right lower leg: No edema.     Left lower leg: No edema.  Skin:    General: Skin is warm and dry.     Capillary Refill: Capillary refill takes less than 2 seconds.     Coloration: Skin is not jaundiced or pale.     Findings: No bruising, erythema or lesion.  Neurological:     Mental Status: She is alert and oriented to person, place, and time.     Motor: No weakness.     Coordination: Coordination normal.     Gait: Gait normal.  Psychiatric:        Mood and Affect: Mood normal.        Behavior: Behavior normal.        Thought Content: Thought content  normal.        Judgment: Judgment normal.     No results found for any visits on 07/17/23.      Assessment & Plan:   Problem List Items Addressed This Visit       Cardiovascular and Mediastinum   Essential hypertension, benign - Primary   BP Readings from Last 3 Encounters:  07/17/23 123/75  07/13/23 124/72  06/30/23 (!) 148/83   HTN Controlled .  On amlodipine 5 mg daily, losartan 25 mg daily, chlorthalidone 25 mg daily Continue current medications. No changes in management. Discussed DASH diet and dietary sodium restrictions Continue to increase dietary efforts and exercise.  Appreciate collaboration with cardiology        Mitral valve insufficiency   Has upcoming mitral valve replacement Patient encouraged to maintain close follow-up with cardiology        Other   Tobacco abuse   Smokes cigarettes on some days ,smoking cessation encouraged      Dyslipidemia   Lab Results  Component Value Date   CHOL 166 12/23/2022   HDL 58 12/23/2022   LDLCALC 90 12/23/2022   TRIG 97 12/23/2022   CHOLHDL 2.9 12/23/2022  Encouraged to pick up prescription for atorvastatin 20 mg tablets and take daily as ordered      Diastolic dysfunction   Continue Jardiance 10 mg daily       No orders of the defined types were placed in this encounter.   No follow-ups on file.  Donell Beers, FNP

## 2023-07-17 NOTE — Assessment & Plan Note (Signed)
 Smokes cigarettes on some days ,smoking cessation encouraged

## 2023-07-17 NOTE — Assessment & Plan Note (Signed)
 Lab Results  Component Value Date   CHOL 166 12/23/2022   HDL 58 12/23/2022   LDLCALC 90 12/23/2022   TRIG 97 12/23/2022   CHOLHDL 2.9 12/23/2022  Encouraged to pick up prescription for atorvastatin 20 mg tablets and take daily as ordered

## 2023-07-18 DIAGNOSIS — Z419 Encounter for procedure for purposes other than remedying health state, unspecified: Secondary | ICD-10-CM | POA: Diagnosis not present

## 2023-07-22 DIAGNOSIS — Z7689 Persons encountering health services in other specified circumstances: Secondary | ICD-10-CM | POA: Diagnosis not present

## 2023-08-05 DIAGNOSIS — Z7689 Persons encountering health services in other specified circumstances: Secondary | ICD-10-CM | POA: Diagnosis not present

## 2023-08-05 NOTE — Progress Notes (Unsigned)
 [address]       [place] [address]             [phone]           Beya Tipps [facility] Medical Record #[id number] Date of Birth: 01/14/1966  Donell Beers, FNP Donell Beers, FNP  Chief Complaint: MR    History of Present Illness:     Pt is a very nervous 58 yo woman who was evaluated last year for palpitations that resolved with BB. Pt however was noted on TTE at that time to have moderated MR. Pt was also tachycardic without significant murmur and decision was made to quantitate the MR with CMRI that was felt to be severe MR with a RF of 45% but with also ventricular hypertrophy and mechanism of regurgitation was restricted posterior leaflet. Follow up TEE also confirmed severe MR again mostly central. Had normal LV function and cath without CAD and with no PHTN. Pt reports she has no DOE, lightheadedness, fatigue or chest pain or lower extremity edema      Past Medical History:  Diagnosis Date   Chronic pain of right knee 11/18/2022   DJD (degenerative joint disease) 06/28/2013   Essential hypertension, benign 06/28/2013   Hypertension    IFG (impaired fasting glucose) 06/28/2013   Palpitations 11/18/2022   Smoking 06/28/2013   Tobacco abuse 11/18/2022    Past Surgical History:  Procedure Laterality Date   RIGHT/LEFT HEART CATH AND CORONARY ANGIOGRAPHY N/A 06/30/2023   Procedure: RIGHT/LEFT HEART CATH AND CORONARY ANGIOGRAPHY;  Surgeon: Tonny Bollman, MD;  Location: [facility] INVASIVE CV LAB;  Service: Cardiovascular;  Laterality: N/A;   TRANSESOPHAGEAL ECHOCARDIOGRAM (CATH LAB) N/A 06/30/2023   Procedure: TRANSESOPHAGEAL ECHOCARDIOGRAM;  Surgeon: Wendall Stade, MD;  Location: [facility] INVASIVE CV LAB;  Service: Cardiovascular;  Laterality: N/A;    Social History   Tobacco Use  Smoking Status Every Day   Current packs/day: 0.50   Average packs/day: 0.5 packs/day for 15.0 years (7.5 ttl pk-yrs)   Types: Cigarettes  Smokeless  Tobacco Not on file    Social History   Substance and Sexual Activity  Alcohol Use Yes   Alcohol/week: 2.0 standard drinks of alcohol   Types: 2 Standard drinks or equivalent per week   Comment: couple of beers     Social History   Socioeconomic History   Marital status: Single    Spouse name: Not on file   Number of children: 3   Years of education: Not on file   Highest education level: Not on file  Occupational History   Not on file  Tobacco Use   Smoking status: Every Day    Current packs/day: 0.50    Average packs/day: 0.5 packs/day for 15.0 years (7.5 ttl pk-yrs)    Types: Cigarettes   Smokeless tobacco: Not on file  Substance and Sexual Activity   Alcohol use: Yes    Alcohol/week: 2.0 standard drinks of alcohol    Types: 2 Standard drinks or equivalent per week    Comment: couple of beers    Drug use: No   Sexual activity: Not Currently  Other Topics Concern   Not on file  Social History Narrative   Lives home alone    Social Drivers of Health   Financial Resource Strain: Not on file  Food Insecurity: Not on file  Transportation Needs: Not on file  Physical Activity: Not on file  Stress: Not on file  Social Connections:  Not on file  Intimate Partner Violence: Not on file    No Known Allergies  Current Outpatient Medications  Medication Sig Dispense Refill   amLODipine (NORVASC) 5 MG tablet Take 1 tablet (5 mg total) by mouth daily. 90 tablet 1   aspirin EC 81 MG tablet Take 1 tablet (81 mg total) by mouth daily. Swallow whole.     atorvastatin (LIPITOR) 20 MG tablet Take 1 tablet (20 mg total) by mouth daily. (Patient not taking: Reported on 07/17/2023) 90 tablet 3   chlorthalidone (HYGROTON) 25 MG tablet Take 25 mg by mouth daily.     cyclobenzaprine (FLEXERIL) 5 MG tablet Take 1 tablet (5 mg total) by mouth at bedtime as needed for muscle spasms. 30 tablet 0   diclofenac Sodium (VOLTAREN) 1 % GEL Apply 4 g topically 4 (four) times daily. (Patient not  taking: Reported on 07/17/2023) 50 g 1   empagliflozin (JARDIANCE) 10 MG TABS tablet Take 1 tablet (10 mg total) by mouth daily before breakfast. 30 tablet 11   losartan (COZAAR) 25 MG tablet Take 1 tablet (25 mg total) by mouth daily. 30 tablet 6   No current facility-administered medications for this visit.     Family History  Problem Relation Age of Onset   Cancer Mother    Diabetes Mother    Cancer Father    Diabetes Father    Cancer Sister    Heart disease Sister    Hypertension Sister    Diabetes Sister        Physical Exam: Nervous Lungs; Clear  Card: tachy no murmur Ext: no edema Neuro; no focal deficits Dentures     Diagnostic Studies & Laboratory data: I have personally reviewed the following studies and agree with the findings   TTE (02/2023) IMPRESSIONS     1. Left ventricular ejection fraction, by estimation, is 60 to 65%. Left  ventricular ejection fraction by 3D volume is 61 %. The left ventricle has  normal function. The left ventricle has no regional wall motion  abnormalities. There is severe concentric   left ventricular hypertrophy. Left ventricular diastolic parameters are  consistent with Grade I diastolic dysfunction (impaired relaxation).  Elevated left ventricular end-diastolic pressure. The average left  ventricular global longitudinal strain is  -19.0 %. The global longitudinal strain is normal.   2. Right ventricular systolic function is normal. The right ventricular  size is normal. Tricuspid regurgitation signal is inadequate for assessing  PA pressure.   3. The mitral valve is normal in structure. Moderate mitral valve  regurgitation. No evidence of mitral stenosis.   4. The aortic valve is tricuspid. Aortic valve regurgitation is mild to  moderate. Aortic valve sclerosis/calcification is present, without any  evidence of aortic stenosis. Aortic regurgitation PHT measures 536 msec.   5. Aortic dilatation noted. There is mild  dilatation of the ascending  aorta, measuring 43 mm.   6. The inferior vena cava is normal in size with greater than 50%  respiratory variability, suggesting right atrial pressure of 3 mmHg.   FINDINGS   Left Ventricle: Left ventricular ejection fraction, by estimation, is 60  to 65%. Left ventricular ejection fraction by 3D volume is 61 %. The left  ventricle has normal function. The left ventricle has no regional wall  motion abnormalities. The average  left ventricular global longitudinal strain is -19.0 %. The global  longitudinal strain is normal. The left ventricular internal cavity size  was normal in size. There is severe concentric  left ventricular  hypertrophy. Left ventricular diastolic parameters   are consistent with Grade I diastolic dysfunction (impaired relaxation).  Elevated left ventricular end-diastolic pressure.   Right Ventricle: The right ventricular size is normal. No increase in  right ventricular wall thickness. Right ventricular systolic function is  normal. Tricuspid regurgitation signal is inadequate for assessing PA  pressure.   Left Atrium: Left atrial size was normal in size.   Right Atrium: Right atrial size was normal in size.   Pericardium: There is no evidence of pericardial effusion.   Mitral Valve: The mitral valve is normal in structure. Moderate mitral  valve regurgitation, with eccentric anteriorly directed jet. No evidence  of mitral valve stenosis.   Tricuspid Valve: The tricuspid valve is normal in structure. Tricuspid  valve regurgitation is mild . No evidence of tricuspid stenosis.   Aortic Valve: The aortic valve is tricuspid. Aortic valve regurgitation is  mild to moderate. Aortic regurgitation PHT measures 536 msec. Aortic valve  sclerosis/calcification is present, without any evidence of aortic  stenosis.   Pulmonic Valve: The pulmonic valve was normal in structure. Pulmonic valve  regurgitation is trivial. No evidence of  pulmonic stenosis.   Aorta: Aortic dilatation noted. There is mild dilatation of the ascending  aorta, measuring 43 mm.   Venous: The inferior vena cava is normal in size with greater than 50%  respiratory variability, suggesting right atrial pressure of 3 mmHg.   IAS/Shunts: No atrial level shunt detected by color flow Doppler.     LEFT VENTRICLE  PLAX 2D  LVIDd:         3.30 cm         Diastology  LVIDs:         2.10 cm         LV e' medial:    4.79 cm/s  LV PW:         1.50 cm         LV E/e' medial:  15.5  LV IVS:        1.50 cm         LV e' lateral:   5.98 cm/s  LVOT diam:     2.10 cm         LV E/e' lateral: 12.4  LV SV:         62  LV SV Index:   39              2D  LVOT Area:     3.46 cm        Longitudinal                                 Strain                                 2D Strain GLS  -19.5 %                                 (A2C):                                 2D Strain GLS  -18.4 %                                 (  A3C):                                 2D Strain GLS  -19.1 %                                 (A4C):                                 2D Strain GLS  -19.0 %                                 Avg:                                   3D Volume EF                                 LV 3D EF:    Left                                              ventricul                                              ar                                              ejection                                              fraction                                              by 3D                                              volume is                                              61 %.                                   3D Volume EF:  3D EF:        61 %                                 LV EDV:       145 ml                                 LV ESV:       56 ml                                 LV SV:        89 ml   RIGHT VENTRICLE  RV Basal diam:  2.80  cm  RV S prime:     8.59 cm/s  TAPSE (M-mode): 2.0 cm   LEFT ATRIUM             Index        RIGHT ATRIUM           Index  LA diam:        3.70 cm 2.34 cm/m   RA Area:     10.50 cm  LA Vol (A2C):   33.0 ml 20.88 ml/m  RA Volume:   19.60 ml  12.40 ml/m  LA Vol (A4C):   42.6 ml 26.95 ml/m  LA Biplane Vol: 39.5 ml 24.99 ml/m   AORTIC VALVE             PULMONIC VALVE  LVOT Vmax:   93.40 cm/s  PR End Diast Vel: 6.40 msec  LVOT Vmean:  53.100 cm/s  LVOT VTI:    0.179 m  AI PHT:      536 msec    AORTA  Ao Root diam: 3.10 cm  Ao Asc diam:  4.30 cm   MITRAL VALVE  MV Area (PHT): 4.31 cm     SHUNTS  MV Decel Time: 176 msec     Systemic VTI:  0.18 m  MR Peak grad: 83.9 mmHg     Systemic Diam: 2.10 cm  MR Mean grad: 51.0 mmHg  MR Vmax:      458.00 cm/s  MR Vmean:     332.0 cm/s  MV E velocity: 74.40 cm/s  MV A velocity: 106.00 cm/s  MV E/A ratio:  0.70   TEE (06/2023) IMPRESSIONS     1. Left ventricular ejection fraction, by estimation, is 60 to 65%. The  left ventricle has normal function. There is severe left ventricular  hypertrophy.   2. Right ventricular systolic function is normal. The right ventricular  size is normal.   3. Left atrial size was moderately dilated. No left atrial/left atrial  appendage thrombus was detected.   4. Right atrial size was mildly dilated.   5. Central MR with PISA radius 1.0 cm , ERO 0.38 cm2 RV 68 cc all  suggestive of severe MR Unable to r/o posterior medial cleft on 3 D images  vs drop out Restricted tethered posterior leaflet . The mitral valve is  abnormal. Severe mitral valve  regurgitation.   6. The aortic valve is tricuspid. There is mild calcification of the  aortic valve. Aortic valve regurgitation is mild. Aortic valve sclerosis  is present, with no evidence of aortic valve stenosis.   7. Aortic dilatation noted. There is mild dilatation of  the ascending  aorta, measuring 39 mm.   FINDINGS   Left Ventricle: Left ventricular  ejection fraction, by estimation, is 60  to 65%. The left ventricle has normal function. The left ventricular  internal cavity size was normal in size. There is severe left ventricular  hypertrophy.   Right Ventricle: The right ventricular size is normal. No increase in  right ventricular wall thickness. Right ventricular systolic function is  normal.   Left Atrium: Left atrial size was moderately dilated. No left atrial/left  atrial appendage thrombus was detected.   Right Atrium: Right atrial size was mildly dilated.   Pericardium: Trivial pericardial effusion is present. The pericardial  effusion is anterior to the right ventricle.   Mitral Valve: Central MR with PISA radius 1.0 cm , ERO 0.38 cm2 RV 68 cc  all suggestive of severe MR Unable to r/o posterior medial cleft on 3 D  images vs drop out Restricted tethered posterior leaflet. The mitral valve  is abnormal. Severe mitral valve  regurgitation. MV peak gradient, 4.9 mmHg. The mean mitral valve gradient  is 2.0 mmHg.   Tricuspid Valve: The tricuspid valve is normal in structure. Tricuspid  valve regurgitation is mild.   Aortic Valve: The aortic valve is tricuspid. There is mild calcification  of the aortic valve. Aortic valve regurgitation is mild. Aortic valve  sclerosis is present, with no evidence of aortic valve stenosis.   Pulmonic Valve: The pulmonic valve was normal in structure. Pulmonic valve  regurgitation is mild.   Aorta: Aortic dilatation noted. There is mild dilatation of the ascending  aorta, measuring 39 mm.   IAS/Shunts: No atrial level shunt detected by color flow Doppler.     MITRAL VALVE  MV Peak grad: 4.9 mmHg  MV Mean grad: 2.0 mmHg  MV Vmax:      1.11 m/s  MV Vmean:     73.7 cm/s  MR Peak grad:    132.7 mmHg  MR Mean grad:    72.0 mmHg  MR Vmax:         576.00 cm/s  MR Vmean:        396.0 cm/s  MR PISA:         6.28 cm  MR PISA Eff ROA: 38 mm  MR PISA Radius:  1.00 cm   CATH  (06/2023) Conclusion  1.  Mild nonobstructive coronary artery disease as detailed (codominant coronary anatomy) 2.  Normal right and left heart hemodynamics: RA mean pressure 2 mmHg RV pressure 27/6 mmHg PA pressure 25/9 mean 15 mmHg Pulmonary capillary wedge pressure A-wave 10, V wave 14, mean 12 mmHg LVEDP 14 mmHg 3.  Severe mitral regurgitation by noninvasive assessment with TEE, unremarkable hemodynamics by cardiac catheterization     Recent Radiology Findings:   MRI Cardiac (04/2023) FINDINGS: LEFT VENTRICLE:   Normal left ventricular chamber size.   Severely increased left ventricular wall thickness. Maximal wall thickness 18 mm in the basal septal wall.   Normal left ventricular systolic function.   LVEF = 61%, visually 65-70%   There are regional wall motion abnormalities: Focal hypokinesis of the mid-inferolateral wall.   No myocardial edema, T2 = 46 msec   Abnormal first pass perfusion, transmural perfusion defect in the inferior base including inferoseptal and inferolateral walls. Inferolateral perfusion defect at the mid ventricle, transmural.   There is post contrast delayed myocardial enhancement:   Midmyocardial delayed enhancement in the basal septum. Minimal patchy delayed enhancement in the basal to mid ventricular septum.  Patchy subendocardial and midmyocardial scar in the anterior base-mid.   There is focal on diffuse near transmural delayed enhancement in the basal inferior and inferolateral walls > 50% of the myocardial thickness, extending to the mid inferolateral wall subendocardial to mid myocardial, >50% myocardial thickness. In the apex there is a subendocardial lateral wall focus of LGE which may be at the base of the papillary muscle.   Findings in combination are most suggestive of prior infarct. However if coronary assessment is unrevealing, these findings could represent atypical scar pattern of HCM vs sarcoidosis. Scar  burden visually >15% of myocardial mass, challenging to quantitate.   Normal T1 myocardial nulling kinetics suggest against a diagnosis of cardiac amyloidosis.   ECV of most normal myocardium is normal range = 26%   RIGHT VENTRICLE:   Normal right ventricular chamber size.   Normal right ventricular wall thickness.   Borderline right ventricular systolic function.   RVEF = 48%, visually normal range   There are no regional wall motion abnormalities.   No post contrast delayed myocardial enhancement.   ATRIA:   Left atrium: Mild dilation   Right atrium: Normal size   VALVES:   Tricuspid aortic valve.   Restricted posterior mitral valve leaflet likely secondary to regional wall motion abnormality.   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   PERICARDIUM:   Normal pericardium.  Small pericardial effusion.   OTHER: No significant extracardiac findings.   MEASUREMENTS: Qp/Qs: Unable to be accurately calculated, likely gating error.   Aortic valve regurgitation: Trivial, regurgitant fraction 3.5%   Pulmonary valve regurgitation: Mild, regurgitant fraction 9.5%   Mitral valve regurgitation: Severe, regurgitant fraction 45%   Tricuspid valve regurgitation: unable to be accurately calculated.   Left ventricle:   LV female   LV EF: 61% (normal 52-79%)   Absolute volumes:   LV EDV: 100 (Normal 78-167 mL)   LV ESV: 39mL (normal 21-64 mL)   LV SV: 62mL (Normal 52-114 mL)   CO: 6.6L/min (normal 2.7-6.3 L/min)   Indexed volumes:   LV EDV: 77mL/sq-m (normal 50-96 mL/sq-m)   LV ESV: 25mL/sq-m (Normal 10-40 mL/sq-m)   LV SV: 36mL/sq-m (Normal 33-64 mL/sq-m)   CI: 4.16L/min/sq-m (Normal 1.9-3.9 L/min/sq-m)   Right ventricle:   RV female   RV EF: 48% (normal 52-80%)   Absolute volumes:   RV EDV: 81mL (normal 79-175 mL)   RV ESV: 42mL (Normal 13-75 mL)   RV SV: 39mL (Normal 56-110 mL)   CO: 4.2L/min (Normal 2.7-6 L/min)    Indexed volumes:   RV EDV: 72mL/sq-m (Normal 51-97 mL/sq-m)   RV ESV: 76mL/sq-m (Normal 9-42 mL/sq-m)   RV SV: 53mL/sq-m (Normal 35-61 mL/sq-m)   CI: 2.63L/min/sq-m (Normal 1.8-3.8 L/min/sq-m)   IMPRESSION: 1. Normal left ventricular chamber size with normal LV systolic function. LVEF 61%.   2. Severely increased LV wall thickness, max wall thickness 18 mm basal septum. No systolic anterior motion of the mitral valve is seen. No visual LVOT obstruction, no flow dephasing.   3. Mid-myocardial delayed enhancement in the basal septum. Minimal patchy delayed enhancement in the basal to mid ventricular septum, in area of greatest wall thickness. Patchy subendocardial and midmyocardial scar in the basal-mid anterior wall. In addition, there is focal on diffuse near transmural delayed enhancement in the basal inferior and inferolateral walls extending to the mid inferolateral wall subendocardial to mid myocardial, >50% myocardial thickness suggesting non-viable myocardium. In the apex there is a subendocardial lateral wall focus  of LGE <50% myocardial thickness, suggesting viability. These findings in combination with first pass perfusion defect in the inferior/inferolateral walls and severe MR with apparent posteriorly leaflet restriction are suspicious for infarct. Consider coronary artery assessment. If no obstructive coronary artery disease, findings may be consistent with variant pattern of scar in hypertrophic cardiomyopathy or less likely but possibly sarcoidosis.   4. Severe mitral valve regurgitation, regurgitant fraction 45%. Mechanism most likely restricted posterior mitral valve leaflet due to wall motion abnormality.   5. Normal right ventricular chamber size with borderline normal right ventricular systolic function, RVEF 48%   6.  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   7.  Small pericardial effusion.      Recent Lab Findings: Lab Results   Component Value Date   WBC 5.4 06/30/2023   HGB 12.2 06/30/2023   HCT 36.0 06/30/2023   PLT 255 06/30/2023   GLUCOSE 101 (H) 06/30/2023   CHOL 166 12/23/2022   TRIG 97 12/23/2022   HDL 58 12/23/2022   LDLCALC 90 12/23/2022   ALT 15 11/18/2022   AST 26 11/18/2022   NA 132 (L) 06/30/2023   K 3.3 (L) 06/30/2023   CL 103 06/30/2023   CREATININE 0.59 06/30/2023   BUN 8 06/30/2023   CO2 25 06/30/2023   TSH 1.400 11/18/2022   HGBA1C 5.8 (H) 12/23/2022      Assessment / Plan:     58 yo female with NYHA class 0 symptoms with severe MR with mechanism appearing to restrictive posterior leaflet but not obviously rheumatic or with CAD. Pt without symptoms and no PHTN or LV dysfunction nor afib with a higher potential for valve replacement with restricted valve would do best at this point to have ongoing surviellance for any of the above to occur prior to intervention. She was very relieved not to feel surgery was needed at this pont. Will see as needed in future by Dr Lynnette Caffey   I have spent 60 min in review of the records, viewing studies and in face to face with patient and in coordination of future care    Eugenio Hoes 08/05/2023 12:53 PM

## 2023-08-06 ENCOUNTER — Encounter: Payer: Self-pay | Admitting: Thoracic Surgery (Cardiothoracic Vascular Surgery)

## 2023-08-06 ENCOUNTER — Institutional Professional Consult (permissible substitution) (INDEPENDENT_AMBULATORY_CARE_PROVIDER_SITE_OTHER): Payer: Medicaid Other | Admitting: Thoracic Surgery (Cardiothoracic Vascular Surgery)

## 2023-08-06 VITALS — BP 152/96 | HR 124 | Resp 20 | Ht 66.0 in | Wt 121.0 lb

## 2023-08-06 DIAGNOSIS — H5213 Myopia, bilateral: Secondary | ICD-10-CM | POA: Diagnosis not present

## 2023-08-06 DIAGNOSIS — I340 Nonrheumatic mitral (valve) insufficiency: Secondary | ICD-10-CM | POA: Diagnosis not present

## 2023-08-06 NOTE — Patient Instructions (Signed)
 Follow up prn

## 2023-08-20 ENCOUNTER — Telehealth: Payer: Self-pay | Admitting: Internal Medicine

## 2023-08-20 NOTE — Telephone Encounter (Signed)
 Spoke with patient ans she would like to know if she needs appointment with Dr. Lynnette Caffey on 4/9

## 2023-08-20 NOTE — Telephone Encounter (Signed)
 Pt requesting a cb to see if she needs to keep pending appt with [place]

## 2023-08-21 NOTE — Progress Notes (Deleted)
 Cardiology Office Note:   Date:  08/21/2023  ID:  Regina Daugherty, DOB 1966-04-22, MRN [id number] PCP:  Donell Beers, FNP  [facility] Providers Cardiologist:  Alverda Skeans, MD Referring MD: Donell Beers, FNP  Chief Complaint/Reason for Referral: Cardiology follow-up ASSESSMENT:    1. Mitral valve insufficiency, unspecified etiology   2. Diastolic dysfunction without heart failure   3. Essential hypertension, benign   4. Palpitations   5. Tobacco abuse counseling   6. Coronary artery disease involving native coronary artery of native heart without angina pectoris   7. Hyperlipidemia LDL goal <70       PLAN:   In order of problems listed above: Mitral regurgitation: CMR and TEE  demonstrates severe mitral regurgitation.   Diastolic dysfunction: Continue losartan 25 mg daily and Jardiance 10 mg daily.   Hypertension:  Continue amlodipine 5mg , losartan 25mg , and chlorthalidone 25 mg.*** Palpitations: No longer seems to be an issue. Tobacco abuse:  Trying to quit with herbal supplements. Coronary artery disease: Continue aspirin 81 mg and atorvastatin 20 mg. Hyperlipidemia: Continue atorvastatin 20 mg check lipid panel, LFTs, and LP(a) today.***        Informed Consent   Shared Decision Making/Informed Consent         Dispo:  No follow-ups on file.      Medication Adjustments/Labs and Tests Ordered: Current medicines are reviewed at length with the patient today.  Concerns regarding medicines are outlined above.  The following changes have been made:     Labs/tests ordered: No orders of the defined types were placed in this encounter.   Medication Changes: No orders of the defined types were placed in this encounter.   Current medicines are reviewed at length with the patient today.  The patient does not have concerns regarding medicines.  I spent 32 minutes reviewing all clinical data during and prior to this visit including all relevant  imaging studies, laboratories, clinical information from other health systems and prior notes from both Cardiology and other specialties, interviewing the patient, conducting a complete physical examination, and coordinating care in order to formulate a comprehensive and personalized evaluation and treatment plan.   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 female with the indicated medical history here for recommendations regarding palpitations.  The patient was seen by her primary care provider in July.  At that point in time she was complaining of dizziness.  An EKG at that time demonstrated left ventricular hypertrophy and possible left atrial enlargement.  For this reason she was referred to cardiology.  Labs at that time including a TSH, CMP, and CBC were within normal limits.    The patient tells me that her palpitations have resolved.  She tells me that this was due to the fact she was taking her blood pressure medications.  She tells me she is doing well.  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  She denies any exertional angina, exertional dyspnea, presyncope, palpitations, paroxysmal nocturnal dyspnea, orthopnea.  She does continue to smoke however she is down to about 6 cigarettes a week.  She denies any claudication symptoms.  She is trying to quit and is on a NicoDerm patch.  Plan: Obtain echocardiogram and monitor; start chlorthalidone 25 mg and check BMP  in 1 week.  Nurse blood pressure check in 1 week.  1/25: In the interim the patient did not have a monitor completed.  Her echocardiogram demonstrated severe LVH and she was referred for cardiac MRI which showed severe mitral valve regurgitation and a perfusion defect in the inferolateral lateral wall.  She is here to discuss those findings.  She actually feels quite well.  She is nervous today about the findings on of her CMR.  She denies any chest pain or shortness of breath.  She is able to go up and down stairs without any issues.  She is surprised that her CMR demonstrated severe mitral regurgitation and possibly an old myocardial infarction.  She has been compliant with her medical therapy without issues.  Plan: Refer for TEE, coronary angiography, and right heart catheterization.  2/25: The patient had a TEE which confirmed severe mitral regurgitation likely due to posterior restricted leaflet.  Her coronary angiography study demonstrated mild nonobstructive disease in her right heart dynamics were relatively normal with no giant V waves seen.  She returns to discuss these findings.  The patient denies any significant shortness of breath.  Her palpitations are much improved.  She is very anxious about the findings of her studies.  She denies any chest pain, paroxysmal nocturnal dyspnea, or orthopnea, severe bleeding or bruising, or signs or symptoms of stroke.  She has tried to quit smoking.  She has been abstaining from smoking for a few weeks and is using an herbal supplement to help with this.  She is otherwise well and without complaints.  She has been completely compliant with her medical therapy.  Plan: Start aspirin 81 mg, atorvastatin 20 mg, check lipid panel and LFTs LP(a) in 2 months; refer to cardiothoracic surgery for surgical mitral valve repair consideration.  April 2025:  Patient consents to use of AI scribe.*** The patient did not have her blood drawn for her lipid blood work.  She  was seen by Dr. Milinda Antis and due to her lack of symptoms he did not think surgical intervention was required at this time.  She returns for follow-up.           Current Medications: No outpatient medications have been marked as taking for the 08/26/23 encounter (Appointment) with Orbie Pyo, MD.     Review of Systems:   Please see the history of present illness.    All other systems reviewed and are negative.     EKGs/Labs/Other Test Reviewed:   EKG: EKG from January 2025 demonstrates sinus tachycardia with rightward axis  EKG Interpretation Date/Time:    Ventricular Rate:    PR Interval:    QRS Duration:    QT Interval:    QTC Calculation:   R Axis:      Text Interpretation:           Risk Assessment/Calculations:          Physical Exam:   VS:  There were no vitals taken for this visit.   No BP recorded.  {Refresh Note OR Click here to enter BP  :1}***   Wt Readings from Last 3 Encounters:  08/06/23 121 lb (54.9 kg)  07/17/23 120 lb (54.4 kg)  07/13/23  119 lb 3.2 oz (54.1 kg)      GENERAL:  No apparent distress, AOx3 HEENT:  No carotid bruits, +2 carotid impulses, no scleral icterus CAR: Tachycardic no murmurs, gallops, rubs, or thrills RES:  Clear to auscultation bilaterally ABD:  Soft, nontender, nondistended, positive bowel sounds x 4 VASC:  +2 radial pulses, +2 carotid pulses NEURO:  CN 2-12 grossly intact; motor and sensory grossly intact PSYCH:  No active depression or anxiety EXT:  No edema, ecchymosis, or cyanosis  Signed, Orbie Pyo, MD  08/21/2023 7:31 AM    [facility] [address] [place], [place], [place]  [address] Phone: [phone]; Fax: [phone]   Note:  This document was prepared using Dragon voice recognition software and may include unintentional dictation errors.

## 2023-08-24 NOTE — Telephone Encounter (Signed)
 Appointment has been cancelled. Echo and next appointment are scheduled.

## 2023-08-26 ENCOUNTER — Ambulatory Visit: Payer: Medicaid Other | Admitting: Internal Medicine

## 2023-08-26 DIAGNOSIS — Z716 Tobacco abuse counseling: Secondary | ICD-10-CM

## 2023-08-26 DIAGNOSIS — R002 Palpitations: Secondary | ICD-10-CM

## 2023-08-26 DIAGNOSIS — I5189 Other ill-defined heart diseases: Secondary | ICD-10-CM

## 2023-08-26 DIAGNOSIS — I10 Essential (primary) hypertension: Secondary | ICD-10-CM

## 2023-08-26 DIAGNOSIS — E785 Hyperlipidemia, unspecified: Secondary | ICD-10-CM

## 2023-08-26 DIAGNOSIS — I2510 Atherosclerotic heart disease of native coronary artery without angina pectoris: Secondary | ICD-10-CM

## 2023-08-26 DIAGNOSIS — I340 Nonrheumatic mitral (valve) insufficiency: Secondary | ICD-10-CM

## 2023-08-29 DIAGNOSIS — Z419 Encounter for procedure for purposes other than remedying health state, unspecified: Secondary | ICD-10-CM | POA: Diagnosis not present

## 2023-09-23 DIAGNOSIS — Z7689 Persons encountering health services in other specified circumstances: Secondary | ICD-10-CM | POA: Diagnosis not present

## 2023-09-28 DIAGNOSIS — Z419 Encounter for procedure for purposes other than remedying health state, unspecified: Secondary | ICD-10-CM | POA: Diagnosis not present

## 2023-10-19 ENCOUNTER — Ambulatory Visit: Payer: Self-pay | Admitting: Nurse Practitioner

## 2023-10-23 ENCOUNTER — Ambulatory Visit: Payer: Self-pay | Admitting: Nurse Practitioner

## 2023-10-29 DIAGNOSIS — Z419 Encounter for procedure for purposes other than remedying health state, unspecified: Secondary | ICD-10-CM | POA: Diagnosis not present

## 2023-11-28 DIAGNOSIS — Z419 Encounter for procedure for purposes other than remedying health state, unspecified: Secondary | ICD-10-CM | POA: Diagnosis not present

## 2023-12-09 ENCOUNTER — Ambulatory Visit: Payer: Self-pay | Admitting: Nurse Practitioner

## 2023-12-09 ENCOUNTER — Other Ambulatory Visit: Payer: Self-pay | Admitting: Internal Medicine

## 2023-12-11 ENCOUNTER — Ambulatory Visit (INDEPENDENT_AMBULATORY_CARE_PROVIDER_SITE_OTHER): Payer: Self-pay | Admitting: Nurse Practitioner

## 2023-12-11 ENCOUNTER — Encounter: Payer: Self-pay | Admitting: Nurse Practitioner

## 2023-12-11 VITALS — BP 141/84 | Wt 120.0 lb

## 2023-12-11 DIAGNOSIS — E785 Hyperlipidemia, unspecified: Secondary | ICD-10-CM | POA: Diagnosis not present

## 2023-12-11 DIAGNOSIS — D649 Anemia, unspecified: Secondary | ICD-10-CM | POA: Diagnosis not present

## 2023-12-11 DIAGNOSIS — G8929 Other chronic pain: Secondary | ICD-10-CM

## 2023-12-11 DIAGNOSIS — I10 Essential (primary) hypertension: Secondary | ICD-10-CM

## 2023-12-11 DIAGNOSIS — Z7689 Persons encountering health services in other specified circumstances: Secondary | ICD-10-CM | POA: Diagnosis not present

## 2023-12-11 DIAGNOSIS — R718 Other abnormality of red blood cells: Secondary | ICD-10-CM | POA: Diagnosis not present

## 2023-12-11 DIAGNOSIS — F17210 Nicotine dependence, cigarettes, uncomplicated: Secondary | ICD-10-CM | POA: Diagnosis not present

## 2023-12-11 DIAGNOSIS — Z720 Tobacco use: Secondary | ICD-10-CM

## 2023-12-11 DIAGNOSIS — I5189 Other ill-defined heart diseases: Secondary | ICD-10-CM | POA: Diagnosis not present

## 2023-12-11 DIAGNOSIS — M25561 Pain in right knee: Secondary | ICD-10-CM

## 2023-12-11 MED ORDER — AMLODIPINE BESYLATE 5 MG PO TABS: 5.0000 mg | ORAL_TABLET | Freq: Every day | ORAL | 1 refills | Status: AC

## 2023-12-11 MED ORDER — EMPAGLIFLOZIN 10 MG PO TABS: 10.0000 mg | ORAL_TABLET | Freq: Every day | ORAL | 1 refills | Status: AC

## 2023-12-11 MED ORDER — AMLODIPINE BESYLATE 5 MG PO TABS: 5.0000 mg | ORAL_TABLET | Freq: Every day | ORAL | 1 refills | Status: DC

## 2023-12-11 MED ORDER — TIZANIDINE HCL 2 MG PO CAPS: 2.0000 mg | ORAL_CAPSULE | Freq: Three times a day (TID) | ORAL | 1 refills | Status: DC | PRN

## 2023-12-11 MED ORDER — CHLORTHALIDONE 25 MG PO TABS: 25.0000 mg | ORAL_TABLET | Freq: Every day | ORAL | 1 refills | Status: AC

## 2023-12-11 MED ORDER — DICLOFENAC SODIUM 1 % EX GEL: 4.0000 g | Freq: Four times a day (QID) | CUTANEOUS | 1 refills | Status: AC

## 2023-12-11 NOTE — Assessment & Plan Note (Addendum)
 On atorvastatin  20 mg daily Continue current medication Lab Results  Component Value Date   CHOL 166 12/23/2022   HDL 58 12/23/2022   LDLCALC 90 12/23/2022   TRIG 97 12/23/2022   CHOLHDL 2.9 12/23/2022

## 2023-12-11 NOTE — Assessment & Plan Note (Signed)
 Blood pressure is high in the office today but has been previously well-controlled Continue amlodipine  5 mg daily, losartan  25 mg daily, chlorthalidone  25 mg daily Blood pressure goal is less than 130/80 encouraged to monitor blood pressure at home and report dizziness DASH diet and commitment to daily physical activity for a minimum of 30 minutes discussed and encouraged, as a part of hypertension management.      12/11/2023    8:43 AM 12/11/2023    8:34 AM 08/06/2023    3:39 PM 07/17/2023    9:43 AM 07/13/2023    8:49 AM 06/30/2023    6:05 PM 06/30/2023    5:35 PM  BP/Weight  Systolic BP 141 139 152 123 124 148 163  Diastolic BP 84 86 96 75 72 83 78  Wt. (Lbs)  120 121 120 119.2    BMI  19.37 kg/m2 19.53 kg/m2 19.37 kg/m2 19.24 kg/m2

## 2023-12-11 NOTE — Assessment & Plan Note (Signed)
 On Voltaren  gel 4 mg as needed, Flexeril  5 mg as needed at bedtime, Flexeril  makes her feel dizzy will discontinue Flexeril  and start Zanaflex  2 mg 3 times daily as needed or at bedtime as needed

## 2023-12-11 NOTE — Patient Instructions (Signed)
 1. Tobacco abuse (Primary)   2. Essential hypertension, benign  - CMP14+EGFR - chlorthalidone  (HYGROTON ) 25 MG tablet; Take 1 tablet (25 mg total) by mouth daily.  Dispense: 90 tablet; Refill: 1 - amLODipine  (NORVASC ) 5 MG tablet; Take 1 tablet (5 mg total) by mouth daily.  Dispense: 90 tablet; Refill: 1  3. Chronic pain of right knee  - tizanidine  (ZANAFLEX ) 2 MG capsule; Take 1 capsule (2 mg total) by mouth 3 (three) times daily as needed for muscle spasms.  Dispense: 20 capsule; Refill: 1 - diclofenac  Sodium (VOLTAREN ) 1 % GEL; Apply 4 g topically 4 (four) times daily.  Dispense: 50 g; Refill: 1  4. Dyslipidemia  - Lipid panel  5. Anemia, unspecified type  - CBC  6. Diastolic dysfunction  - empagliflozin  (JARDIANCE ) 10 MG TABS tablet; Take 1 tablet (10 mg total) by mouth daily before breakfast.  Dispense: 90 tablet; Refill: 1    It is important that you exercise regularly at least 30 minutes 5 times a week as tolerated  Think about what you will eat, plan ahead. Choose  clean, green, fresh or frozen over canned, processed or packaged foods which are more sugary, salty and fatty. 70 to 75% of food eaten should be vegetables and fruit. Three meals at set times with snacks allowed between meals, but they must be fruit or vegetables. Aim to eat over a 12 hour period , example 7 am to 7 pm, and STOP after  your last meal of the day. Drink water,generally about 64 ounces per day, no other drink is as healthy. Fruit juice is best enjoyed in a healthy way, by EATING the fruit.  Thanks for choosing [facility] we consider it a privelige to serve you.

## 2023-12-11 NOTE — Assessment & Plan Note (Addendum)
 Continue Jardiance  10 mg daily Maintain close follow-up with cardiology

## 2023-12-11 NOTE — Progress Notes (Signed)
 Established Patient Office Visit  Subjective:  Patient ID: Regina Daugherty, female    DOB: [birth year]  Age: 58 y.o. MRN: [id number]  CC:  Chief Complaint  Patient presents with   Hypertension    HPI Regina Daugherty is a 58 y.o. female  has a past medical history of Chronic pain of right knee (11/18/2022), DJD (degenerative joint disease) (06/28/2013), Essential hypertension, benign (06/28/2013), Hypertension, IFG (impaired fasting glucose) (06/28/2013), Palpitations (11/18/2022), Smoking (06/28/2013), and Tobacco abuse (11/18/2022).  Patient presents for follow-up for her chronic medical conditions States that she is doing well generally, currently denies fever, chest pain, shortness of breath, abdominal pain, nausea, vomiting  Encouraged to get Tdap vaccine pneumococcal vaccine and shingles vaccine at the pharmacy   Past Medical History:  Diagnosis Date   Chronic pain of right knee 11/18/2022   DJD (degenerative joint disease) 06/28/2013   Essential hypertension, benign 06/28/2013   Hypertension    IFG (impaired fasting glucose) 06/28/2013   Palpitations 11/18/2022   Smoking 06/28/2013   Tobacco abuse 11/18/2022    Past Surgical History:  Procedure Laterality Date   RIGHT/LEFT HEART CATH AND CORONARY ANGIOGRAPHY N/A 06/30/2023   Procedure: RIGHT/LEFT HEART CATH AND CORONARY ANGIOGRAPHY;  Surgeon: Wonda Sharper, MD;  Location[facility] INVASIVE CV LAB;  Service: Cardiovascular;  Laterality: N/A;   TRANSESOPHAGEAL ECHOCARDIOGRAM (CATH LAB) N/A 06/30/2023   Procedure: TRANSESOPHAGEAL ECHOCARDIOGRAM;  Surgeon: Delford Maude BROCKS, MD;  Location: [facility] INVASIVE CV LAB;  Service: Cardiovascular;  Laterality: N/A;    Family History  Problem Relation Age of Onset   Cancer Mother    Diabetes Mother    Cancer Father    Diabetes Father    Cancer Sister    Heart disease Sister    Hypertension Sister    Diabetes Sister     Social History   Socioeconomic History   Marital status: Single     Spouse name: Not on file   Number of children: 3   Years of education: Not on file   Highest education level: Not on file  Occupational History   Not on file  Tobacco Use   Smoking status: Every Day    Current packs/day: 0.50    Average packs/day: 0.5 packs/day for 15.0 years (7.5 ttl pk-yrs)    Types: Cigarettes   Smokeless tobacco: Not on file  Substance and Sexual Activity   Alcohol use: Yes    Alcohol/week: 2.0 standard drinks of alcohol    Types: 2 Standard drinks or equivalent per week    Comment: couple of beers    Drug use: No   Sexual activity: Not Currently  Other Topics Concern   Not on file  Social History Narrative   Lives home alone    Social Drivers of Health   Financial Resource Strain: Not on file  Food Insecurity: Not on file  Transportation Needs: Not on file  Physical Activity: Not on file  Stress: Not on file  Social Connections: Not on file  Intimate Partner Violence: Not on file    Outpatient Medications Prior to Visit  Medication Sig Dispense Refill   atorvastatin  (LIPITOR) 20 MG tablet Take 1 tablet (20 mg total) by mouth daily. 90 tablet 3   losartan  (COZAAR ) 25 MG tablet TAKE 1 TABLET(25 MG) BY MOUTH DAILY 90 tablet 2   chlorthalidone  (HYGROTON ) 25 MG tablet Take 25 mg by mouth daily.     cyclobenzaprine  (FLEXERIL ) 5 MG tablet Take 1 tablet (5 mg total) by mouth at bedtime  as needed for muscle spasms. 30 tablet 0   diclofenac  Sodium (VOLTAREN ) 1 % GEL Apply 4 g topically 4 (four) times daily. 50 g 1   empagliflozin  (JARDIANCE ) 10 MG TABS tablet Take 1 tablet (10 mg total) by mouth daily before breakfast. 30 tablet 11   aspirin  EC 81 MG tablet Take 1 tablet (81 mg total) by mouth daily. Swallow whole. (Patient not taking: Reported on 12/11/2023)     amLODipine  (NORVASC ) 5 MG tablet Take 1 tablet (5 mg total) by mouth daily. 90 tablet 1   No facility-administered medications prior to visit.    No Known Allergies  ROS Review of Systems   Constitutional:  Negative for appetite change, chills, fatigue and fever.  HENT:  Negative for congestion, postnasal drip, rhinorrhea and sneezing.   Respiratory:  Negative for cough, shortness of breath and wheezing.   Cardiovascular:  Negative for chest pain, palpitations and leg swelling.  Gastrointestinal:  Negative for abdominal pain, constipation, nausea and vomiting.  Genitourinary:  Negative for difficulty urinating, dysuria, flank pain and frequency.  Musculoskeletal:  Positive for arthralgias. Negative for joint swelling and myalgias.  Skin:  Negative for color change, pallor and rash.  Neurological:  Negative for dizziness, facial asymmetry, weakness, numbness and headaches.  Psychiatric/Behavioral:  Negative for behavioral problems, confusion, self-injury and suicidal ideas.       Objective:    Physical Exam Vitals and nursing note reviewed.  Constitutional:      General: She is not in acute distress.    Appearance: Normal appearance. She is not ill-appearing, toxic-appearing or diaphoretic.     Comments: Thin appearing  Cardiovascular:     Rate and Rhythm: Normal rate and regular rhythm.     Pulses: Normal pulses.     Heart sounds: Normal heart sounds. No murmur heard.    No friction rub. No gallop.  Pulmonary:     Effort: Pulmonary effort is normal. No respiratory distress.     Breath sounds: Normal breath sounds. No stridor. No wheezing, rhonchi or rales.  Chest:     Chest wall: No tenderness.  Abdominal:     General: There is no distension.     Palpations: Abdomen is soft.     Tenderness: There is no abdominal tenderness. There is no right CVA tenderness, left CVA tenderness or guarding.  Musculoskeletal:        General: No swelling, tenderness, deformity or signs of injury.     Right lower leg: No edema.     Left lower leg: No edema.  Skin:    General: Skin is warm and dry.     Capillary Refill: Capillary refill takes 2 to 3 seconds.     Coloration: Skin  is not jaundiced or pale.     Findings: No bruising, erythema or lesion.  Neurological:     Mental Status: She is alert and oriented to person, place, and time.     Motor: No weakness.     Gait: Gait normal.  Psychiatric:        Mood and Affect: Mood normal.        Behavior: Behavior normal.        Thought Content: Thought content normal.        Judgment: Judgment normal.     BP (!) 141/84   Wt 120 lb (54.4 kg)   BMI 19.37 kg/m  Wt Readings from Last 3 Encounters:  12/11/23 120 lb (54.4 kg)  08/06/23 121 lb (54.9 kg)  07/17/23 120 lb (54.4 kg)    Lab Results  Component Value Date   TSH 1.400 11/18/2022   Lab Results  Component Value Date   WBC 5.4 06/30/2023   HGB 12.2 06/30/2023   HCT 36.0 06/30/2023   MCV 70.9 (L) 06/30/2023   PLT 255 06/30/2023   Lab Results  Component Value Date   NA 132 (L) 06/30/2023   K 3.3 (L) 06/30/2023   CO2 25 06/30/2023   GLUCOSE 101 (H) 06/30/2023   BUN 8 06/30/2023   CREATININE 0.59 06/30/2023   BILITOT 0.5 11/18/2022   ALKPHOS 85 11/18/2022   AST 26 11/18/2022   ALT 15 11/18/2022   PROT 7.8 11/18/2022   ALBUMIN 4.8 11/18/2022   CALCIUM  8.4 (L) 06/30/2023   ANIONGAP 4 (L) 06/30/2023   EGFR 95 04/14/2023   Lab Results  Component Value Date   CHOL 166 12/23/2022   Lab Results  Component Value Date   HDL 58 12/23/2022   Lab Results  Component Value Date   LDLCALC 90 12/23/2022   Lab Results  Component Value Date   TRIG 97 12/23/2022   Lab Results  Component Value Date   CHOLHDL 2.9 12/23/2022   Lab Results  Component Value Date   HGBA1C 5.8 (H) 12/23/2022      Assessment & Plan:   Problem List Items Addressed This Visit       Cardiovascular and Mediastinum   Essential hypertension, benign   Blood pressure is high in the office today but has been previously well-controlled Continue amlodipine  5 mg daily, losartan  25 mg daily, chlorthalidone  25 mg daily Blood pressure goal is less than 130/80 encouraged  to monitor blood pressure at home and report dizziness DASH diet and commitment to daily physical activity for a minimum of 30 minutes discussed and encouraged, as a part of hypertension management.      12/11/2023    8:43 AM 12/11/2023    8:34 AM 08/06/2023    3:39 PM 07/17/2023    9:43 AM 07/13/2023    8:49 AM 06/30/2023    6:05 PM 06/30/2023    5:35 PM  BP/Weight  Systolic BP 141 139 152 123 124 148 163  Diastolic BP 84 86 96 75 72 83 78  Wt. (Lbs)  120 121 120 119.2    BMI  19.37 kg/m2 19.53 kg/m2 19.37 kg/m2 19.24 kg/m2             Relevant Medications   chlorthalidone  (HYGROTON ) 25 MG tablet   amLODipine  (NORVASC ) 5 MG tablet   Other Relevant Orders   CMP14+EGFR     Other   Tobacco abuse - Primary   Smokes about 3 cigarettes per /day  Asked about quitting: confirms that she currently smokes cigarettes Advise to quit smoking: Educated about QUITTING to reduce the risk of cancer, cardio and cerebrovascular disease. Assess willingness: Unwilling to quit at this time, but is working on cutting back. Assist with counseling and pharmacotherapy: Counseled for 5 minutes and literature provided. Arrange for follow up: follow up in 3 months and continue to offer help.       Chronic pain of right knee   On Voltaren  gel 4 mg as needed, Flexeril  5 mg as needed at bedtime, Flexeril  makes her feel dizzy will discontinue Flexeril  and start Zanaflex  2 mg 3 times daily as needed or at bedtime as needed      Relevant Medications   tizanidine  (ZANAFLEX ) 2 MG capsule   diclofenac  Sodium (VOLTAREN )  1 % GEL   Dyslipidemia   On atorvastatin  20 mg daily Continue current medication Lab Results  Component Value Date   CHOL 166 12/23/2022   HDL 58 12/23/2022   LDLCALC 90 12/23/2022   TRIG 97 12/23/2022   CHOLHDL 2.9 12/23/2022         Relevant Orders   Lipid panel   Diastolic dysfunction   Continue Jardiance  10 mg daily Maintain close follow-up with cardiology      Relevant  Medications   empagliflozin  (JARDIANCE ) 10 MG TABS tablet   Other Visit Diagnoses       Anemia, unspecified type       Relevant Orders   CBC       Meds ordered this encounter  Medications   DISCONTD: amLODipine  (NORVASC ) 5 MG tablet    Sig: Take 1 tablet (5 mg total) by mouth daily.    Dispense:  90 tablet    Refill:  1    STOP CHLORTHALIDONE    tizanidine  (ZANAFLEX ) 2 MG capsule    Sig: Take 1 capsule (2 mg total) by mouth 3 (three) times daily as needed for muscle spasms.    Dispense:  20 capsule    Refill:  1   diclofenac  Sodium (VOLTAREN ) 1 % GEL    Sig: Apply 4 g topically 4 (four) times daily.    Dispense:  50 g    Refill:  1   chlorthalidone  (HYGROTON ) 25 MG tablet    Sig: Take 1 tablet (25 mg total) by mouth daily.    Dispense:  90 tablet    Refill:  1   amLODipine  (NORVASC ) 5 MG tablet    Sig: Take 1 tablet (5 mg total) by mouth daily.    Dispense:  90 tablet    Refill:  1   empagliflozin  (JARDIANCE ) 10 MG TABS tablet    Sig: Take 1 tablet (10 mg total) by mouth daily before breakfast.    Dispense:  90 tablet    Refill:  1    Follow-up: Return in about 4 months (around 04/12/2024) for CPE.    Symiah Nowotny R Tulio Facundo, FNP

## 2023-12-11 NOTE — Assessment & Plan Note (Addendum)
 Smokes about 3 cigarettes per /day  Asked about quitting: confirms that she currently smokes cigarettes Advise to quit smoking: Educated about QUITTING to reduce the risk of cancer, cardio and cerebrovascular disease. Assess willingness: Unwilling to quit at this time, but is working on cutting back. Assist with counseling and pharmacotherapy: Counseled for 5 minutes and literature provided. Arrange for follow up: follow up in 3 months and continue to offer help.

## 2023-12-12 LAB — CMP14+EGFR
ALT: 17 IU/L (ref 0–32)
AST: 42 IU/L — ABNORMAL HIGH (ref 0–40)
Albumin: 4.8 g/dL (ref 3.8–4.9)
Alkaline Phosphatase: 67 IU/L (ref 44–121)
BUN/Creatinine Ratio: 9 — ABNORMAL LOW (ref 12–28)
BUN: 7 mg/dL — ABNORMAL LOW (ref 8–27)
Bilirubin Total: 0.2 mg/dL (ref 0.0–1.2)
CO2: 19 mmol/L — ABNORMAL LOW (ref 20–29)
Calcium: 9.4 mg/dL (ref 8.7–10.3)
Chloride: 97 mmol/L (ref 96–106)
Creatinine, Ser: 0.76 mg/dL (ref 0.57–1.00)
Globulin, Total: 2.7 g/dL (ref 1.5–4.5)
Glucose: 75 mg/dL (ref 70–99)
Potassium: 4.8 mmol/L (ref 3.5–5.2)
Sodium: 135 mmol/L (ref 134–144)
Total Protein: 7.5 g/dL (ref 6.0–8.5)
eGFR: 90 mL/min/1.73 (ref >59)

## 2023-12-12 LAB — LIPID PANEL
Chol/HDL Ratio: 1.9 ratio (ref 0.0–4.4)
Cholesterol, Total: 145 mg/dL (ref 100–199)
HDL: 77 mg/dL (ref >39)
LDL Chol Calc (NIH): 52 mg/dL (ref 0–99)
Triglycerides: 82 mg/dL (ref 0–149)
VLDL Cholesterol Cal: 16 mg/dL (ref 5–40)

## 2023-12-12 LAB — CBC
Hematocrit: 41.4 % (ref 34.0–46.6)
Hemoglobin: 12.4 g/dL (ref 11.1–15.9)
MCH: 22.4 pg — ABNORMAL LOW (ref 26.6–33.0)
MCHC: 30 g/dL — ABNORMAL LOW (ref 31.5–35.7)
MCV: 75 fL — ABNORMAL LOW (ref 79–97)
Platelets: 319 x10E3/uL (ref 150–450)
RBC: 5.54 x10E6/uL — ABNORMAL HIGH (ref 3.77–5.28)
RDW: 17.2 % — ABNORMAL HIGH (ref 11.7–15.4)
WBC: 7.5 x10E3/uL (ref 3.4–10.8)

## 2023-12-14 ENCOUNTER — Other Ambulatory Visit: Payer: Self-pay | Admitting: Nurse Practitioner

## 2023-12-14 ENCOUNTER — Telehealth: Payer: Self-pay

## 2023-12-14 ENCOUNTER — Telehealth (HOSPITAL_COMMUNITY): Payer: Self-pay

## 2023-12-14 ENCOUNTER — Other Ambulatory Visit (HOSPITAL_COMMUNITY): Payer: Self-pay

## 2023-12-14 ENCOUNTER — Ambulatory Visit: Payer: Self-pay | Admitting: Nurse Practitioner

## 2023-12-14 DIAGNOSIS — R718 Other abnormality of red blood cells: Secondary | ICD-10-CM

## 2023-12-14 DIAGNOSIS — G8929 Other chronic pain: Secondary | ICD-10-CM

## 2023-12-14 MED ORDER — TIZANIDINE HCL 2 MG PO CAPS
2.0000 mg | ORAL_CAPSULE | Freq: Three times a day (TID) | ORAL | 1 refills | Status: DC | PRN
  Filled 2023-12-14: qty 20, 7d supply, fill #0

## 2023-12-14 MED ORDER — TIZANIDINE HCL 2 MG PO TABS
2.0000 mg | ORAL_TABLET | Freq: Three times a day (TID) | ORAL | 0 refills | Status: DC | PRN
  Filled 2023-12-14: qty 30, 10d supply, fill #0

## 2023-12-14 MED ORDER — TIZANIDINE HCL 2 MG PO TABS: 2.0000 mg | ORAL_TABLET | Freq: Three times a day (TID) | ORAL | 0 refills | Status: DC | PRN

## 2023-12-14 NOTE — Telephone Encounter (Signed)
 Copied from CRM [id number]. Topic: Clinical - Prescription Issue >> Dec 14, 2023  9:09 AM Emylou G wrote: Reason for CRM: rcvd prescription - she cancelled it.. couldn't afford it.. can she get the alternative for : tiZANidine  HCl 2 mg Oral 3 times daily PRN not sure if that is the name but it was for muscle spasms

## 2023-12-15 ENCOUNTER — Other Ambulatory Visit (HOSPITAL_COMMUNITY): Payer: Self-pay

## 2023-12-15 ENCOUNTER — Telehealth: Payer: Self-pay

## 2023-12-15 LAB — IRON,TIBC AND FERRITIN PANEL
Ferritin: 309 ng/mL — ABNORMAL HIGH (ref 15–150)
Iron Saturation: 49 % (ref 15–55)
Iron: 153 ug/dL (ref 27–159)
Total Iron Binding Capacity: 315 ug/dL (ref 250–450)
UIBC: 162 ug/dL (ref 131–425)

## 2023-12-15 LAB — SPECIMEN STATUS REPORT

## 2023-12-15 NOTE — Telephone Encounter (Signed)
 Copied from CRM [id number]. Topic: General - Other >> Dec 14, 2023  5:23 PM Winona R wrote: Pt calling to thank Dr. [name] for helping her with getting a medication that she can afford, and sent her appreciation to the entire office. Her son is on her way to pick it up for her.  Pt can use a the 10.00 coupon. KH

## 2023-12-15 NOTE — Telephone Encounter (Signed)
 PA request has been Cancelled. New Encounter has been or will be created for follow up. For additional info see Pharmacy Prior Auth telephone encounter from 12/15/23.

## 2023-12-29 DIAGNOSIS — Z419 Encounter for procedure for purposes other than remedying health state, unspecified: Secondary | ICD-10-CM | POA: Diagnosis not present

## 2024-01-07 ENCOUNTER — Other Ambulatory Visit: Payer: Self-pay | Admitting: Nurse Practitioner

## 2024-01-07 DIAGNOSIS — G8929 Other chronic pain: Secondary | ICD-10-CM

## 2024-01-07 NOTE — Telephone Encounter (Unsigned)
 Copied from CRM [id number]. Topic: Clinical - Medication Refill >> Jan 07, 2024  9:44 AM [name] wrote: Medication: tiZANidine  (ZANAFLEX ) 2 MG tablet   Has the patient contacted their pharmacy? No (Agent: If no, request that the patient contact the pharmacy for the refill. If patient does not wish to contact the pharmacy document the reason why and proceed with request.) (Agent: If yes, when and what did the pharmacy advise?)  This is the patient's preferred pharmacy:  [facility] STORE #[id number] [facility], [place] - [address] AT [facility] [address] [place] [address] Phone: [phone] Fax: [phone]   Is this the correct pharmacy for this prescription? Yes If no, delete pharmacy and type the correct one.   Has the prescription been filled recently? Yes  Is the patient out of the medication? Yes  Has the patient been seen for an appointment in the last year OR does the patient have an upcoming appointment? Yes  Can we respond through MyChart? No  Agent: Please be advised that Rx refills may take up to 3 business days. We ask that you follow-up with your pharmacy.

## 2024-01-07 NOTE — Telephone Encounter (Signed)
 Please advise [facility]

## 2024-01-08 MED ORDER — TIZANIDINE HCL 2 MG PO TABS
2.0000 mg | ORAL_TABLET | Freq: Three times a day (TID) | ORAL | 0 refills | Status: DC | PRN
Start: 2024-01-08 — End: 2024-02-05

## 2024-01-19 NOTE — Progress Notes (Deleted)
 Cardiology Office Note:   Date:  01/19/2024  ID:  Regina Daugherty, DOB [birth year], MRN [id number] PCP:  [name], [name], FNP  [facility] Providers Cardiologist:  Wendel Haws, MD Referring MD: [name], [name], FNP  Chief Complaint/Reason for Referral: Cardiology follow-up ASSESSMENT:    1. Mitral valve insufficiency, unspecified etiology   2. Diastolic dysfunction without heart failure   3. Essential hypertension, benign   4. Palpitations   5. Tobacco abuse counseling   6. Coronary artery disease involving native coronary artery of native heart without angina pectoris   7. Hyperlipidemia LDL goal <70       PLAN:   In order of problems listed above: Mitral regurgitation: *** Diastolic dysfunction: Continue losartan  25 mg daily and Jardiance  10 mg daily.   Hypertension:  Continue amlodipine  5mg , losartan  25mg , and chlorthalidone  25 mg. Palpitations: No longer seems to be an issue. Tobacco abuse:  Trying to quit with herbal supplements. Coronary artery disease: Continue aspirin  81 mg and atorvastatin  20 mg. Hyperlipidemia: Continue atorvastatin  20 mg; LDL was 52 in July of this year; check LP(a) today***       Informed Consent   Shared Decision Making/Informed Consent         Dispo:  No follow-ups on file.      Medication Adjustments/Labs and Tests Ordered: Current medicines are reviewed at length with the patient today.  Concerns regarding medicines are outlined above.  The following changes have been made:     Labs/tests ordered: No orders of the defined types were placed in this encounter.   Medication Changes: No orders of the defined types were placed in this encounter.   Current medicines are reviewed at length with the patient today.  The patient does not have concerns regarding medicines.  I spent *** minutes reviewing all clinical data during and prior to this visit including all relevant imaging studies, laboratories, clinical information  from other health systems and prior notes from both Cardiology and other specialties, interviewing the patient, conducting a complete physical examination, and coordinating care in order to formulate a comprehensive and personalized evaluation and treatment plan.   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 female with the indicated medical history here for recommendations regarding palpitations.  The patient was seen by her primary care provider in July.  At that point in time she was complaining of dizziness.  An EKG at that time demonstrated left ventricular hypertrophy and possible left atrial enlargement.  For this reason she was referred to cardiology.  Labs at that time including a TSH, CMP, and CBC were within normal limits.    The patient tells me that her palpitations have resolved.  She tells me that this was due to the fact she was taking her blood pressure medications.  She tells me she is doing well.  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  She denies any exertional angina, exertional dyspnea, presyncope, palpitations, paroxysmal nocturnal dyspnea, orthopnea.  She does continue to smoke  however she is down to about 6 cigarettes a week.  She denies any claudication symptoms.  She is trying to quit and is on a NicoDerm patch.  Plan: Obtain echocardiogram and monitor; start chlorthalidone  25 mg and check BMP in 1 week.  Nurse blood pressure check in 1  week.  1/25: In the interim the patient did not have a monitor completed.  Her echocardiogram demonstrated severe LVH and she was referred for cardiac MRI which showed severe mitral valve regurgitation and a perfusion defect in the inferolateral lateral wall.  She is here to discuss those findings.  She actually feels quite well.  She is nervous today about the findings on of her CMR.  She denies any chest pain or shortness of breath.  She is able to go up and down stairs without any issues.  She is surprised that her CMR demonstrated severe mitral regurgitation and possibly an old myocardial infarction.  She has been compliant with her medical therapy without issues.  Plan: Refer for TEE, coronary angiography, and right heart catheterization.  2/25: The patient had a TEE which confirmed severe mitral regurgitation likely due to posterior restricted leaflet.  Her coronary angiography study demonstrated mild nonobstructive disease in her right heart dynamics were relatively normal with no giant V waves seen.  She returns to discuss these findings.  The patient denies any significant shortness of breath.  Her palpitations are much improved.  She is very anxious about the findings of her studies.  She denies any chest pain, paroxysmal nocturnal dyspnea, or orthopnea, severe bleeding or bruising, or signs or symptoms of stroke.  She has tried to quit smoking.  She has been abstaining from smoking for a few weeks and is using an herbal supplement to help with this.  She is otherwise well and without complaints.  She has been completely compliant with her medical therapy.  Plan: Refer for surgical evaluation; start aspirin  81 mg, check lipid panel  September 2025:  Patient consents to use of AI scribe. The patient was seen by Dr. [name] and given her lack of symptoms, LV dysfunction, atrial fibrillation, or pulmonary hypertension continued surveillance was recommended.  In the interim her LDL was found to be  52.             Current Medications: No outpatient medications have been marked as taking for the 01/26/24 encounter (Appointment) with Regina Royster K, MD.     Review of Systems:   Please see the history of present illness.    All other systems reviewed and are negative.     EKGs/Labs/Other Test Reviewed:   EKG: EKG from January 2025 demonstrates sinus tachycardia with rightward axis  EKG Interpretation Date/Time:    Ventricular Rate:    PR Interval:    QRS Duration:    QT Interval:    QTC Calculation:   Daugherty Axis:      Text Interpretation:           Risk Assessment/Calculations:          Physical Exam:   VS:  There were no vitals taken for this visit.   No BP recorded.  {Refresh Note OR Click here to enter BP  :1}***   Wt Readings from Last 3 Encounters:  12/11/23 120 lb (54.4 kg)  08/06/23 121 lb (54.9 kg)  07/17/23 120 lb (54.4 kg)      GENERAL:  No apparent distress, AOx3 HEENT:  No carotid bruits, +2 carotid impulses, no scleral icterus  CAR: Tachycardic no murmurs, gallops, rubs, or thrills RES:  Clear to auscultation bilaterally ABD:  Soft, nontender, nondistended, positive bowel sounds x 4 VASC:  +2 radial pulses, +2 carotid pulses NEURO:  CN 2-12 grossly intact; motor and sensory grossly intact PSYCH:  No active depression or anxiety EXT:  No edema, ecchymosis, or cyanosis  Signed, Maxmillian Carsey Daugherty Guled Gahan, MD  01/19/2024 1:43 PM    [facility] [address] [place], [place], [place]  [address] Phone: [phone]; Fax: [phone]   Note:  This document was prepared using Dragon voice recognition software and may include unintentional dictation errors.

## 2024-01-26 ENCOUNTER — Ambulatory Visit (HOSPITAL_COMMUNITY): Admission: RE | Admit: 2024-01-26 | Source: Ambulatory Visit

## 2024-01-26 ENCOUNTER — Ambulatory Visit: Admitting: Internal Medicine

## 2024-01-26 ENCOUNTER — Other Ambulatory Visit (HOSPITAL_COMMUNITY): Payer: Self-pay | Admitting: Internal Medicine

## 2024-01-26 DIAGNOSIS — I059 Rheumatic mitral valve disease, unspecified: Secondary | ICD-10-CM

## 2024-01-26 DIAGNOSIS — E785 Hyperlipidemia, unspecified: Secondary | ICD-10-CM

## 2024-01-26 DIAGNOSIS — I5189 Other ill-defined heart diseases: Secondary | ICD-10-CM

## 2024-01-26 DIAGNOSIS — Z716 Tobacco abuse counseling: Secondary | ICD-10-CM

## 2024-01-26 DIAGNOSIS — Z7689 Persons encountering health services in other specified circumstances: Secondary | ICD-10-CM | POA: Diagnosis not present

## 2024-01-26 DIAGNOSIS — I10 Essential (primary) hypertension: Secondary | ICD-10-CM

## 2024-01-26 DIAGNOSIS — I2510 Atherosclerotic heart disease of native coronary artery without angina pectoris: Secondary | ICD-10-CM

## 2024-01-26 DIAGNOSIS — I340 Nonrheumatic mitral (valve) insufficiency: Secondary | ICD-10-CM

## 2024-01-26 DIAGNOSIS — R002 Palpitations: Secondary | ICD-10-CM

## 2024-01-29 DIAGNOSIS — Z419 Encounter for procedure for purposes other than remedying health state, unspecified: Secondary | ICD-10-CM | POA: Diagnosis not present

## 2024-02-04 ENCOUNTER — Other Ambulatory Visit: Payer: Self-pay | Admitting: Nurse Practitioner

## 2024-02-04 DIAGNOSIS — G8929 Other chronic pain: Secondary | ICD-10-CM

## 2024-02-04 NOTE — Telephone Encounter (Signed)
 Copied from CRM [id number]. Topic: Clinical - Medication Refill >> Feb 04, 2024  2:32 PM Pinkey ORN wrote: Medication: tiZANidine  (ZANAFLEX ) 2 MG tablet  Has the patient contacted their pharmacy? Yes (Agent: If no, request that the patient contact the pharmacy for the refill. If patient does not wish to contact the pharmacy document the reason why and proceed with request.) (Agent: If yes, when and what did the pharmacy advise?)  This is the patient's preferred pharmacy:  [facility] STORE #[id number] [facility], [place] - [address] AT [facility] [address] [place] [address] Phone: [phone] Fax: [phone]   Is this the correct pharmacy for this prescription? Yes If no, delete pharmacy and type the correct one.   Has the prescription been filled recently? No  Is the patient out of the medication? No  Has the patient been seen for an appointment in the last year OR does the patient have an upcoming appointment? Yes  Can we respond through MyChart? No  Agent: Please be advised that Rx refills may take up to 3 business days. We ask that you follow-up with your pharmacy.

## 2024-02-04 NOTE — Telephone Encounter (Signed)
 not  Patient at this office

## 2024-02-05 ENCOUNTER — Other Ambulatory Visit: Payer: Self-pay | Admitting: Nurse Practitioner

## 2024-02-05 DIAGNOSIS — G8929 Other chronic pain: Secondary | ICD-10-CM

## 2024-02-05 MED ORDER — TIZANIDINE HCL 2 MG PO TABS: 2.0000 mg | ORAL_TABLET | Freq: Three times a day (TID) | ORAL | 0 refills | Status: AC | PRN

## 2024-03-11 NOTE — Progress Notes (Deleted)
 Cardiology Office Note:   Date:  03/11/2024  ID:  Regina Daugherty, DOB 25-Aug-1965, MRN [id number] PCP:  [name], [name], FNP  [facility] Providers Cardiologist:  Wendel Haws, MD Referring MD: [name], [name], FNP  Chief Complaint/Reason for Referral: Cardiology follow-up ASSESSMENT:    1. Nonrheumatic mitral valve regurgitation   2. Diastolic dysfunction without heart failure   3. Essential hypertension, benign   4. Palpitations   5. Tobacco abuse counseling   6. Coronary artery disease involving native coronary artery of native heart without angina pectoris   7. Hyperlipidemia LDL goal <70       PLAN:   In order of problems listed above: Mitral regurgitation: CMR and TEE  demonstrates severe mitral regurgitation.  She has no obstructive coronary artery disease.  She does not have pulmonary hypertension or atrial fibrillation.  While her coronary angiography study demonstrated no obstructive disease or occlusions I wonder whether she had a silent MI in the posterolateral territory that recanalized.  However she has no obvious Q waves on her EKG. refer to cardiothoracic surgery at [facility] for consideration for mitral valve repair.*** Diastolic dysfunction: Continue losartan  25 mg daily and Jardiance  10 mg daily.   Hypertension:  Continue amlodipine  5mg , losartan  25mg , and chlorthalidone  25 mg.*** Palpitations: ***. Tobacco abuse:  Trying to quit with herbal supplements. Coronary artery disease: Continue aspirin  81 mg and atorvastatin  20 mg. Hyperlipidemia: Continue atorvastatin  20 mg check check lipids, LFTs, LP(a) today***        Informed Consent   Shared Decision Making/Informed Consent         Dispo:  No follow-ups on file.      Medication Adjustments/Labs and Tests Ordered: Current medicines are reviewed at length with the patient today.  Concerns regarding medicines are outlined above.  The following changes have been made:     Labs/tests ordered: No  orders of the defined types were placed in this encounter.   Medication Changes: No orders of the defined types were placed in this encounter.   Current medicines are reviewed at length with the patient today.  The patient does not have concerns regarding medicines.  I spent *** minutes reviewing all clinical data during and prior to this visit including all relevant imaging studies, laboratories, clinical information from other health systems and prior notes from both Cardiology and other specialties, interviewing the patient, conducting a complete physical examination, and coordinating care in order to formulate a comprehensive and personalized evaluation and treatment plan.   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 female with the indicated medical history here for recommendations regarding palpitations.  The patient was seen by her primary care provider in July.  At that point in time she was complaining of dizziness.  An EKG at that time demonstrated left ventricular hypertrophy and possible left atrial enlargement.  For this reason she was referred to cardiology.  Labs at that time including a TSH, CMP, and CBC were within normal limits.    The patient tells me that her palpitations  have resolved.  She tells me that this was due to the fact she was taking her blood pressure medications.  She tells me she is doing well.  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  She denies any exertional angina, exertional dyspnea, presyncope, palpitations, paroxysmal nocturnal dyspnea, orthopnea.  She does continue to smoke however she is down to about 6 cigarettes a week.  She denies any claudication symptoms.  She is trying to quit and is on a NicoDerm patch.  Plan: Obtain echocardiogram and monitor; start chlorthalidone  25 mg and check BMP in 1 week.  Nurse blood pressure check in 1 week.  1/25: In the interim the patient did not have a monitor completed.  Her echocardiogram demonstrated severe LVH and she was referred for cardiac MRI which showed severe mitral valve regurgitation and a perfusion defect in the inferolateral lateral wall.  She is here to discuss those findings.  She actually feels quite well.  She is nervous today about the findings on of her CMR.  She denies any chest pain or shortness of breath.  She is able to go up and down stairs without any issues.  She is surprised that her CMR demonstrated severe mitral regurgitation and possibly an old myocardial infarction.  She has been compliant with her medical therapy without issues.  Plan: Refer for TEE, coronary angiography, and right heart catheterization.  2/25: The patient had a TEE which confirmed severe mitral regurgitation likely due to posterior restricted leaflet.  Her coronary angiography study demonstrated mild nonobstructive disease in her right heart dynamics were relatively normal with no giant V waves seen.  She returns to discuss these findings.  The patient denies any significant shortness of breath.  Her palpitations are much improved.  She is very anxious about the findings of her studies.  She denies any chest pain, paroxysmal nocturnal dyspnea, or orthopnea, severe bleeding or bruising, or signs or symptoms of stroke.  She has tried to quit smoking.  She has been abstaining from smoking for a few weeks and is using an herbal supplement to help with this.  She is otherwise well and  without complaints.  She has been completely compliant with her medical therapy.  Plan: Refer to cardiac surgery for mitral valve repair.  Start aspirin  81 mg, atorvastatin  20 mg.  Check lipid panel LFTs and LP(a) in 2 months.  October 2025:  Patient consents to use of AI scribe. Does not look like the patient was seen by cardiac surgery.  LP(a) was not drawn however her LDL in July was 52             Current Medications: No outpatient medications have been marked as taking for the 03/14/24 encounter (Appointment) with Arturo Freundlich K, MD.     Review of Systems:   Please see the history of present illness.    All other systems reviewed and are negative.     EKGs/Labs/Other Test Reviewed:   EKG: EKG from February 2025 demonstrates sinus tachycardia   EKG Interpretation Date/Time:    Ventricular Rate:    PR Interval:    QRS Duration:    QT Interval:    QTC Calculation:   R Axis:      Text Interpretation:           Risk Assessment/Calculations:          Physical Exam:   VS:  There were no vitals taken for this visit.  No BP recorded.  {Refresh Note OR Click here to enter BP  :1}***   Wt Readings from Last 3 Encounters:  12/11/23 120 lb (54.4 kg)  08/06/23 121 lb (54.9 kg)  07/17/23 120 lb (54.4 kg)      GENERAL:  No apparent distress, AOx3 HEENT:  No carotid bruits, +2 carotid impulses, no scleral icterus CAR: Tachycardic no murmurs, gallops, rubs, or thrills RES:  Clear to auscultation bilaterally ABD:  Soft, nontender, nondistended, positive bowel sounds x 4 VASC:  +2 radial pulses, +2 carotid pulses NEURO:  CN 2-12 grossly intact; motor and sensory grossly intact PSYCH:  No active depression or anxiety EXT:  No edema, ecchymosis, or cyanosis  Signed, Jaymes Hang K Aissa Lisowski, MD  03/11/2024 10:58 AM    [facility] [address] [place], [place], [place]  [address] Phone: [phone]; Fax: [phone]   Note:  This document was prepared  using Dragon voice recognition software and may include unintentional dictation errors.

## 2024-03-14 ENCOUNTER — Encounter (HOSPITAL_COMMUNITY): Payer: Self-pay | Admitting: Internal Medicine

## 2024-03-14 ENCOUNTER — Ambulatory Visit: Attending: Internal Medicine | Admitting: Internal Medicine

## 2024-03-14 ENCOUNTER — Ambulatory Visit (HOSPITAL_COMMUNITY)
Admission: RE | Admit: 2024-03-14 | Source: Ambulatory Visit | Attending: Internal Medicine | Admitting: Internal Medicine

## 2024-03-14 DIAGNOSIS — Z716 Tobacco abuse counseling: Secondary | ICD-10-CM

## 2024-03-14 DIAGNOSIS — R002 Palpitations: Secondary | ICD-10-CM

## 2024-03-14 DIAGNOSIS — E785 Hyperlipidemia, unspecified: Secondary | ICD-10-CM

## 2024-03-14 DIAGNOSIS — I10 Essential (primary) hypertension: Secondary | ICD-10-CM

## 2024-03-14 DIAGNOSIS — I340 Nonrheumatic mitral (valve) insufficiency: Secondary | ICD-10-CM

## 2024-03-14 DIAGNOSIS — I5189 Other ill-defined heart diseases: Secondary | ICD-10-CM

## 2024-03-14 DIAGNOSIS — I2510 Atherosclerotic heart disease of native coronary artery without angina pectoris: Secondary | ICD-10-CM

## 2024-03-18 ENCOUNTER — Telehealth: Payer: Self-pay

## 2024-03-18 NOTE — Telephone Encounter (Signed)
 Called to check on patient as she cancelled her echo and visit with Dr. Wendel.  Left message to call back. Will reschedule appointments if the patient wishes to arrange.

## 2024-03-19 DEATH — deceased

## 2024-03-21 NOTE — Telephone Encounter (Signed)
 Left message to call back.

## 2024-03-24 NOTE — Telephone Encounter (Signed)
 Left message to call back.  Letter sent to the patient to contact the office to arrange visits.

## 2024-04-01 ENCOUNTER — Telehealth: Payer: Self-pay

## 2024-04-01 NOTE — Telephone Encounter (Signed)
 Copied from CRM #[id number]. Topic: General - Other >> Apr 01, 2024  3:35 PM Santiya F wrote: Reason for CRM: Cecelia with [facility] to inform patient's provider that the transthoracic echocardiogram can be done peer to peer or clinical notes can be faxed over. She says the peer to peer number is: [phone] and the fax number for the notes is: [phone]. Cecelia did not provide a callback number due to her calls being outbound only.

## 2024-04-05 NOTE — Telephone Encounter (Signed)
 Done [facility]

## 2024-04-08 ENCOUNTER — Ambulatory Visit: Admitting: Internal Medicine

## 2024-04-08 ENCOUNTER — Other Ambulatory Visit (HOSPITAL_COMMUNITY)

## 2024-04-18 ENCOUNTER — Encounter: Payer: Self-pay | Admitting: Nurse Practitioner
